# Patient Record
Sex: Male | Born: 1965 | Race: Black or African American | Hispanic: No | Marital: Single
Health system: Southern US, Community
[De-identification: ages and names within clinical notes are randomized; demographics above are authoritative.]

---

## 2005-07-02 ENCOUNTER — Emergency Department: Payer: Self-pay | Admitting: Unknown Physician Specialty

## 2005-07-20 ENCOUNTER — Ambulatory Visit: Payer: Self-pay | Admitting: Specialist

## 2006-02-21 ENCOUNTER — Emergency Department: Payer: Self-pay | Admitting: Emergency Medicine

## 2006-03-17 ENCOUNTER — Inpatient Hospital Stay: Payer: Self-pay | Admitting: Internal Medicine

## 2006-07-08 ENCOUNTER — Ambulatory Visit: Payer: Self-pay | Admitting: Urology

## 2006-10-18 ENCOUNTER — Ambulatory Visit: Payer: Self-pay | Admitting: Urology

## 2006-12-01 ENCOUNTER — Emergency Department: Payer: Self-pay | Admitting: Emergency Medicine

## 2006-12-05 ENCOUNTER — Inpatient Hospital Stay: Payer: Self-pay | Admitting: *Deleted

## 2007-01-27 ENCOUNTER — Emergency Department: Payer: Self-pay | Admitting: Emergency Medicine

## 2007-05-23 ENCOUNTER — Emergency Department: Payer: Self-pay | Admitting: Emergency Medicine

## 2007-06-21 ENCOUNTER — Emergency Department: Payer: Self-pay | Admitting: Internal Medicine

## 2007-09-05 ENCOUNTER — Encounter: Payer: Self-pay | Admitting: Specialist

## 2007-09-12 ENCOUNTER — Emergency Department: Payer: Self-pay | Admitting: Emergency Medicine

## 2007-09-18 ENCOUNTER — Inpatient Hospital Stay: Payer: Self-pay | Admitting: Internal Medicine

## 2007-10-04 ENCOUNTER — Encounter: Payer: Self-pay | Admitting: Specialist

## 2007-11-13 ENCOUNTER — Emergency Department: Payer: Self-pay | Admitting: Emergency Medicine

## 2007-12-07 ENCOUNTER — Ambulatory Visit: Payer: Self-pay | Admitting: Specialist

## 2007-12-20 ENCOUNTER — Ambulatory Visit: Payer: Self-pay | Admitting: Specialist

## 2008-03-01 ENCOUNTER — Other Ambulatory Visit: Payer: Self-pay

## 2008-03-01 ENCOUNTER — Emergency Department: Payer: Self-pay | Admitting: Emergency Medicine

## 2008-03-11 ENCOUNTER — Ambulatory Visit: Payer: Self-pay | Admitting: Family Medicine

## 2008-03-21 ENCOUNTER — Emergency Department: Payer: Self-pay | Admitting: Internal Medicine

## 2008-04-14 ENCOUNTER — Inpatient Hospital Stay: Payer: Self-pay | Admitting: Internal Medicine

## 2008-05-22 ENCOUNTER — Ambulatory Visit: Payer: Self-pay | Admitting: Vascular Surgery

## 2008-06-13 ENCOUNTER — Ambulatory Visit: Payer: Self-pay | Admitting: Vascular Surgery

## 2008-06-24 ENCOUNTER — Emergency Department: Payer: Self-pay | Admitting: Emergency Medicine

## 2008-11-18 ENCOUNTER — Inpatient Hospital Stay: Payer: Self-pay | Admitting: Internal Medicine

## 2008-12-30 ENCOUNTER — Emergency Department: Payer: Self-pay | Admitting: Internal Medicine

## 2009-01-02 ENCOUNTER — Ambulatory Visit: Payer: Self-pay | Admitting: Oncology

## 2009-01-18 ENCOUNTER — Inpatient Hospital Stay: Payer: Self-pay | Admitting: Internal Medicine

## 2009-02-01 ENCOUNTER — Inpatient Hospital Stay: Payer: Self-pay | Admitting: Internal Medicine

## 2009-02-02 ENCOUNTER — Ambulatory Visit: Payer: Self-pay | Admitting: Oncology

## 2009-03-13 ENCOUNTER — Inpatient Hospital Stay: Payer: Self-pay | Admitting: Internal Medicine

## 2009-04-21 ENCOUNTER — Inpatient Hospital Stay: Payer: Self-pay | Admitting: Internal Medicine

## 2009-04-30 ENCOUNTER — Inpatient Hospital Stay: Payer: Self-pay | Admitting: Internal Medicine

## 2009-06-23 ENCOUNTER — Emergency Department: Payer: Self-pay | Admitting: Internal Medicine

## 2009-07-02 ENCOUNTER — Inpatient Hospital Stay: Payer: Self-pay | Admitting: Internal Medicine

## 2009-07-27 ENCOUNTER — Emergency Department: Payer: Self-pay | Admitting: Internal Medicine

## 2009-08-01 ENCOUNTER — Inpatient Hospital Stay: Payer: Self-pay | Admitting: Internal Medicine

## 2009-09-14 ENCOUNTER — Inpatient Hospital Stay: Payer: Self-pay | Admitting: Internal Medicine

## 2009-10-09 ENCOUNTER — Inpatient Hospital Stay: Payer: Self-pay | Admitting: Specialist

## 2010-02-18 ENCOUNTER — Ambulatory Visit: Payer: Self-pay | Admitting: Family Medicine

## 2010-03-10 ENCOUNTER — Ambulatory Visit: Payer: Self-pay | Admitting: Family Medicine

## 2010-03-14 ENCOUNTER — Ambulatory Visit: Payer: Self-pay | Admitting: Family Medicine

## 2010-12-01 ENCOUNTER — Ambulatory Visit: Payer: Self-pay | Admitting: Family Medicine

## 2011-07-30 ENCOUNTER — Ambulatory Visit: Payer: Self-pay

## 2011-10-11 ENCOUNTER — Encounter: Payer: Self-pay | Admitting: Cardiothoracic Surgery

## 2011-10-11 ENCOUNTER — Encounter: Payer: Self-pay | Admitting: Nurse Practitioner

## 2011-11-03 ENCOUNTER — Encounter: Payer: Self-pay | Admitting: Cardiothoracic Surgery

## 2011-11-03 ENCOUNTER — Encounter: Payer: Self-pay | Admitting: Nurse Practitioner

## 2011-11-15 ENCOUNTER — Inpatient Hospital Stay: Payer: Self-pay | Admitting: Specialist

## 2011-11-15 LAB — COMPREHENSIVE METABOLIC PANEL
Albumin: 2.5 g/dL — ABNORMAL LOW (ref 3.4–5.0)
Alkaline Phosphatase: 100 U/L (ref 50–136)
Anion Gap: 12 (ref 7–16)
Bilirubin,Total: 0.2 mg/dL (ref 0.2–1.0)
Chloride: 106 mmol/L (ref 98–107)
Creatinine: 0.68 mg/dL (ref 0.60–1.30)
EGFR (African American): >60
EGFR (Non-African Amer.): >60
Osmolality: 272 (ref 275–301)
SGOT(AST): 16 U/L (ref 15–37)
Total Protein: 8.1 g/dL (ref 6.4–8.2)

## 2011-11-15 LAB — URINALYSIS, COMPLETE
Bilirubin,UR: NEGATIVE
Hyaline Cast: 10
Ph: 6 (ref 4.5–8.0)
RBC,UR: 19 /HPF (ref 0–5)
Specific Gravity: 1.011 (ref 1.003–1.030)

## 2011-11-15 LAB — CBC WITH DIFFERENTIAL/PLATELET
Basophil #: 0.2 10*3/uL — ABNORMAL HIGH (ref 0.0–0.1)
Lymphocyte %: 24.9 %
MCH: 30.3 pg (ref 26.0–34.0)
MCV: 91 fL (ref 80–100)
Monocyte #: 1.3 x10 3/mm — ABNORMAL HIGH (ref 0.2–1.0)
Neutrophil %: 59.9 %
Platelet: 636 10*3/uL — ABNORMAL HIGH (ref 150–440)
RBC: 4.51 10*6/uL (ref 4.40–5.90)
RDW: 16.2 % — ABNORMAL HIGH (ref 11.5–14.5)
WBC: 13.2 10*3/uL — ABNORMAL HIGH (ref 3.8–10.6)

## 2011-11-16 LAB — BASIC METABOLIC PANEL
Anion Gap: 12 (ref 7–16)
BUN: 4 mg/dL — ABNORMAL LOW (ref 7–18)
Calcium, Total: 7.4 mg/dL — ABNORMAL LOW (ref 8.5–10.1)
Chloride: 107 mmol/L (ref 98–107)
Creatinine: 0.51 mg/dL — ABNORMAL LOW (ref 0.60–1.30)

## 2011-11-16 LAB — CBC WITH DIFFERENTIAL/PLATELET
Basophil %: 1 %
Eosinophil %: 5.5 %
HCT: 32.8 % — ABNORMAL LOW (ref 40.0–52.0)
HGB: 10.6 g/dL — ABNORMAL LOW (ref 13.0–18.0)
Lymphocyte #: 2.8 10*3/uL (ref 1.0–3.6)
Lymphocyte %: 32.2 %
MCH: 29.1 pg (ref 26.0–34.0)
MCV: 90 fL (ref 80–100)
Neutrophil #: 3.9 10*3/uL (ref 1.4–6.5)
Neutrophil %: 44.9 %
Platelet: 551 10*3/uL — ABNORMAL HIGH (ref 150–440)
RBC: 3.64 10*6/uL — ABNORMAL LOW (ref 4.40–5.90)
RDW: 16.1 % — ABNORMAL HIGH (ref 11.5–14.5)
WBC: 8.8 10*3/uL (ref 3.8–10.6)

## 2011-11-19 LAB — URINALYSIS, COMPLETE
Bilirubin,UR: NEGATIVE
Nitrite: POSITIVE
RBC,UR: 31 /HPF (ref 0–5)
Squamous Epithelial: 2
WBC UR: 176 /HPF (ref 0–5)

## 2011-11-20 ENCOUNTER — Inpatient Hospital Stay: Payer: Self-pay | Admitting: Internal Medicine

## 2011-11-20 LAB — COMPREHENSIVE METABOLIC PANEL
Albumin: 2.2 g/dL — ABNORMAL LOW (ref 3.4–5.0)
Anion Gap: 10 (ref 7–16)
Bilirubin,Total: 0.3 mg/dL (ref 0.2–1.0)
Calcium, Total: 8 mg/dL — ABNORMAL LOW (ref 8.5–10.1)
Co2: 23 mmol/L (ref 21–32)
Creatinine: 0.41 mg/dL — ABNORMAL LOW (ref 0.60–1.30)
EGFR (African American): >60
EGFR (Non-African Amer.): >60
Glucose: 76 mg/dL (ref 65–99)
Osmolality: 276 (ref 275–301)
Potassium: 3.5 mmol/L (ref 3.5–5.1)
SGPT (ALT): 11 U/L — ABNORMAL LOW
Sodium: 140 mmol/L (ref 136–145)
Total Protein: 6.9 g/dL (ref 6.4–8.2)

## 2011-11-20 LAB — URINALYSIS, COMPLETE
Bilirubin,UR: NEGATIVE
Glucose,UR: NEGATIVE mg/dL (ref 0–75)
Nitrite: POSITIVE
Ph: 7 (ref 4.5–8.0)
RBC,UR: 440 /HPF (ref 0–5)
Squamous Epithelial: 21
Transitional Epi: 2
WBC UR: 2971 /HPF (ref 0–5)

## 2011-11-20 LAB — CBC
HGB: 12.3 g/dL — ABNORMAL LOW (ref 13.0–18.0)
MCH: 30.2 pg (ref 26.0–34.0)
MCHC: 33.6 g/dL (ref 32.0–36.0)
Platelet: 786 10*3/uL — ABNORMAL HIGH (ref 150–440)
RDW: 16.6 % — ABNORMAL HIGH (ref 11.5–14.5)

## 2011-11-20 LAB — CULTURE, BLOOD (SINGLE)

## 2011-11-23 LAB — CBC WITH DIFFERENTIAL/PLATELET
Basophil #: 0 10*3/uL (ref 0.0–0.1)
Basophil %: 2.1 %
Eosinophil #: 0.6 10*3/uL (ref 0.0–0.7)
Eosinophil %: 7.3 %
HCT: 30.9 % — ABNORMAL LOW (ref 40.0–52.0)
HGB: 10.6 g/dL — ABNORMAL LOW (ref 13.0–18.0)
Lymphocyte %: 39 %
MCHC: 34.3 g/dL (ref 32.0–36.0)
MCV: 90 fL (ref 80–100)
Monocyte %: 14.2 %
Neutrophil #: 3.1 10*3/uL (ref 1.4–6.5)
RBC: 3.44 10*6/uL — ABNORMAL LOW (ref 4.40–5.90)
RDW: 16 % — ABNORMAL HIGH (ref 11.5–14.5)

## 2011-11-23 LAB — URINE CULTURE

## 2011-11-28 LAB — CBC
HCT: 35.4 % — ABNORMAL LOW (ref 40.0–52.0)
MCH: 29.2 pg (ref 26.0–34.0)
MCV: 89 fL (ref 80–100)
RBC: 3.97 10*6/uL — ABNORMAL LOW (ref 4.40–5.90)
WBC: 9.5 10*3/uL (ref 3.8–10.6)

## 2011-11-28 LAB — URINALYSIS, COMPLETE
Bilirubin,UR: NEGATIVE
Glucose,UR: NEGATIVE mg/dL (ref 0–75)
Hyaline Cast: 3
Nitrite: NEGATIVE
RBC,UR: 50 /HPF (ref 0–5)
Specific Gravity: 1.017 (ref 1.003–1.030)
Squamous Epithelial: 2

## 2011-11-28 LAB — COMPREHENSIVE METABOLIC PANEL
Anion Gap: 13 (ref 7–16)
BUN: 3 mg/dL — ABNORMAL LOW (ref 7–18)
Calcium, Total: 7.5 mg/dL — ABNORMAL LOW (ref 8.5–10.1)
Chloride: 111 mmol/L — ABNORMAL HIGH (ref 98–107)
Co2: 19 mmol/L — ABNORMAL LOW (ref 21–32)
Creatinine: 0.64 mg/dL (ref 0.60–1.30)
EGFR (Non-African Amer.): >60
Glucose: 78 mg/dL (ref 65–99)
SGOT(AST): 22 U/L (ref 15–37)
SGPT (ALT): 10 U/L — ABNORMAL LOW
Sodium: 143 mmol/L (ref 136–145)

## 2011-11-28 LAB — CK TOTAL AND CKMB (NOT AT ARMC): CK-MB: 2 ng/mL (ref 0.5–3.6)

## 2011-11-29 LAB — BASIC METABOLIC PANEL
BUN: 2 mg/dL — ABNORMAL LOW (ref 7–18)
Chloride: 116 mmol/L — ABNORMAL HIGH (ref 98–107)
Creatinine: 0.58 mg/dL — ABNORMAL LOW (ref 0.60–1.30)
EGFR (African American): >60
EGFR (Non-African Amer.): >60
Osmolality: 289 (ref 275–301)
Potassium: 3.1 mmol/L — ABNORMAL LOW (ref 3.5–5.1)

## 2011-11-30 LAB — BASIC METABOLIC PANEL
Anion Gap: 11 (ref 7–16)
BUN: 3 mg/dL — ABNORMAL LOW (ref 7–18)
Co2: 20 mmol/L — ABNORMAL LOW (ref 21–32)
EGFR (African American): >60
EGFR (Non-African Amer.): >60
Glucose: 85 mg/dL (ref 65–99)
Osmolality: 284 (ref 275–301)
Sodium: 145 mmol/L (ref 136–145)

## 2011-11-30 LAB — CLOSTRIDIUM DIFFICILE BY PCR

## 2011-12-01 ENCOUNTER — Inpatient Hospital Stay: Payer: Self-pay | Admitting: Internal Medicine

## 2011-12-04 ENCOUNTER — Encounter: Payer: Self-pay | Admitting: Cardiothoracic Surgery

## 2011-12-04 ENCOUNTER — Encounter: Payer: Self-pay | Admitting: Nurse Practitioner

## 2011-12-04 LAB — CULTURE, BLOOD (SINGLE)

## 2012-01-03 ENCOUNTER — Encounter: Payer: Self-pay | Admitting: Cardiothoracic Surgery

## 2012-01-31 ENCOUNTER — Ambulatory Visit: Payer: Self-pay

## 2012-02-03 ENCOUNTER — Encounter: Payer: Self-pay | Admitting: Cardiothoracic Surgery

## 2012-03-05 ENCOUNTER — Inpatient Hospital Stay: Payer: Self-pay | Admitting: Specialist

## 2012-03-05 LAB — URINALYSIS, COMPLETE
Bilirubin,UR: NEGATIVE
Glucose,UR: NEGATIVE mg/dL (ref 0–75)
Protein: 100
RBC,UR: 113 /HPF (ref 0–5)
Specific Gravity: 1.016 (ref 1.003–1.030)
Squamous Epithelial: 7
WBC UR: 659 /HPF (ref 0–5)

## 2012-03-05 LAB — COMPREHENSIVE METABOLIC PANEL
Anion Gap: 14 (ref 7–16)
BUN: 10 mg/dL (ref 7–18)
Bilirubin,Total: 0.3 mg/dL (ref 0.2–1.0)
Calcium, Total: 8.7 mg/dL (ref 8.5–10.1)
Chloride: 111 mmol/L — ABNORMAL HIGH (ref 98–107)
Co2: 17 mmol/L — ABNORMAL LOW (ref 21–32)
EGFR (African American): >60
EGFR (Non-African Amer.): >60
Potassium: 3.6 mmol/L (ref 3.5–5.1)
SGOT(AST): 23 U/L (ref 15–37)
SGPT (ALT): 12 U/L (ref 12–78)
Total Protein: 8.2 g/dL (ref 6.4–8.2)

## 2012-03-05 LAB — CBC WITH DIFFERENTIAL/PLATELET
Basophil #: 0.2 10*3/uL — ABNORMAL HIGH (ref 0.0–0.1)
Basophil %: 1.3 %
Eosinophil #: 0.3 10*3/uL (ref 0.0–0.7)
HCT: 37 % — ABNORMAL LOW (ref 40.0–52.0)
HGB: 12 g/dL — ABNORMAL LOW (ref 13.0–18.0)
Lymphocyte #: 1.9 10*3/uL (ref 1.0–3.6)
Lymphocyte %: 14.2 %
MCH: 27.7 pg (ref 26.0–34.0)
MCHC: 32.4 g/dL (ref 32.0–36.0)
MCV: 85 fL (ref 80–100)
Neutrophil #: 10 10*3/uL — ABNORMAL HIGH (ref 1.4–6.5)
Neutrophil %: 73 %
RBC: 4.34 10*6/uL — ABNORMAL LOW (ref 4.40–5.90)
RDW: 17 % — ABNORMAL HIGH (ref 11.5–14.5)

## 2012-03-06 LAB — BASIC METABOLIC PANEL
Calcium, Total: 8.7 mg/dL (ref 8.5–10.1)
Chloride: 108 mmol/L — ABNORMAL HIGH (ref 98–107)
Creatinine: 0.63 mg/dL (ref 0.60–1.30)
EGFR (African American): >60
EGFR (Non-African Amer.): >60
Glucose: 82 mg/dL (ref 65–99)
Osmolality: 275 (ref 275–301)

## 2012-03-06 LAB — CBC WITH DIFFERENTIAL/PLATELET
Basophil #: 0.1 10*3/uL (ref 0.0–0.1)
Eosinophil #: 0.9 10*3/uL — ABNORMAL HIGH (ref 0.0–0.7)
HGB: 10.4 g/dL — ABNORMAL LOW (ref 13.0–18.0)
Lymphocyte #: 2.8 10*3/uL (ref 1.0–3.6)
Lymphocyte %: 34.9 %
MCHC: 32.7 g/dL (ref 32.0–36.0)
Monocyte #: 1.1 x10 3/mm — ABNORMAL HIGH (ref 0.2–1.0)
Neutrophil #: 3.1 10*3/uL (ref 1.4–6.5)
Neutrophil %: 39.2 %
Platelet: 470 10*3/uL — ABNORMAL HIGH (ref 150–440)
RBC: 3.73 10*6/uL — ABNORMAL LOW (ref 4.40–5.90)
WBC: 8 10*3/uL (ref 3.8–10.6)

## 2012-03-06 LAB — MAGNESIUM: Magnesium: 2.1 mg/dL

## 2012-03-11 LAB — CULTURE, BLOOD (SINGLE)

## 2012-04-06 ENCOUNTER — Encounter: Payer: Self-pay | Admitting: Cardiothoracic Surgery

## 2012-04-06 ENCOUNTER — Encounter: Payer: Self-pay | Admitting: Nurse Practitioner

## 2012-05-05 ENCOUNTER — Encounter: Payer: Self-pay | Admitting: Nurse Practitioner

## 2012-05-05 ENCOUNTER — Encounter: Payer: Self-pay | Admitting: Cardiothoracic Surgery

## 2012-05-31 IMAGING — CT CT ABD-PELV W/O CM
1 of 2 series · 14 of 32 positions shown, 18 images · non-contrast
Comparison: none

REASON FOR EXAM: (1) intractable nausea, vomiting nad diarrhea; (2)
intractable nausea, vomiting
COMMENTS:

PROCEDURE:     CT  - CT ABDOMEN AND PELVIS W[DATE] [DATE]
RESULT:
Comparison is made to a prior study dated 04/28/2009.
TECHNIQUE: Helical noncontrast 3 mm sections were obtained from the lung
bases through the pubic symphysis.

[Series 2: 3mm soft tissue · axial · 0.85mm/px · z∈[-822,-394]mm · 14 of 163 slices shown, 18 images]
[im 13/163  soft-tissue]
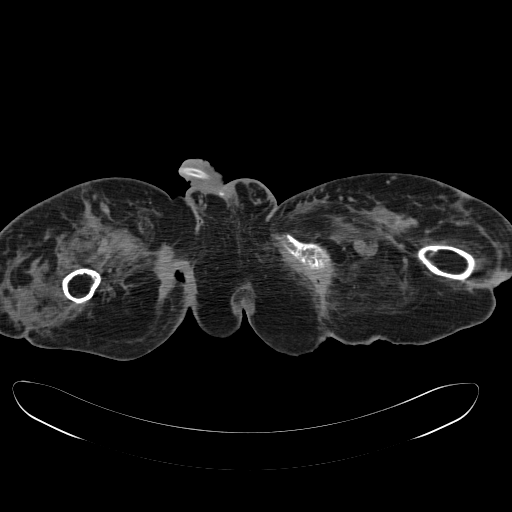
[im 13/163  bone]
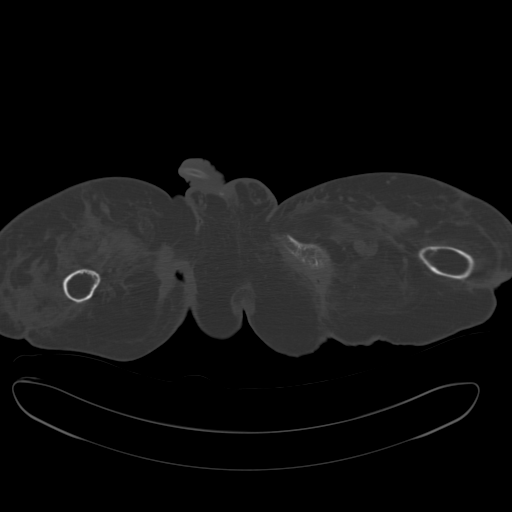
[im 26/163  soft-tissue]
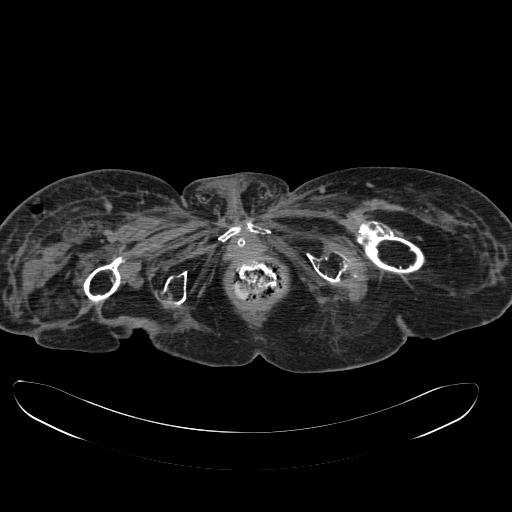
[im 39/163  soft-tissue]
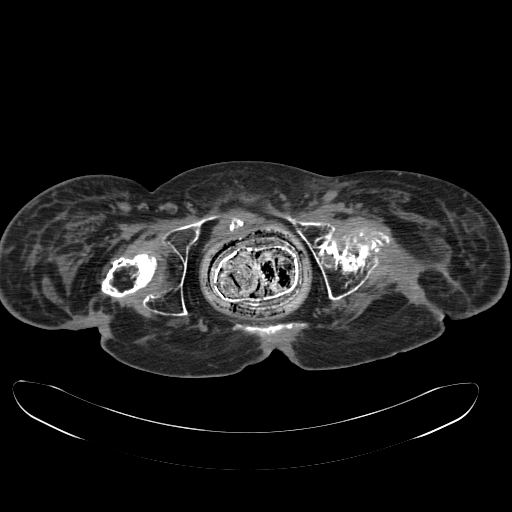
[im 52/163  soft-tissue]
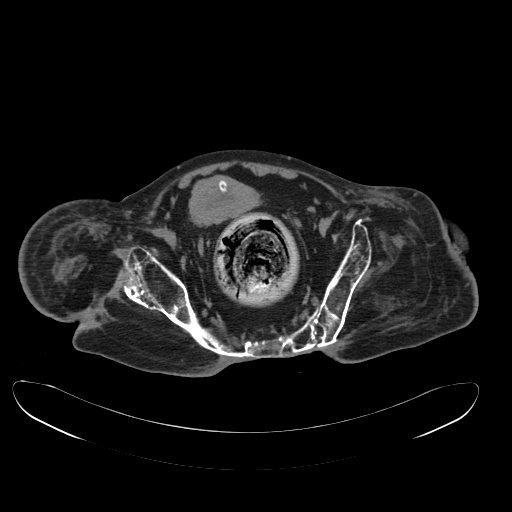
[im 65/163  soft-tissue]
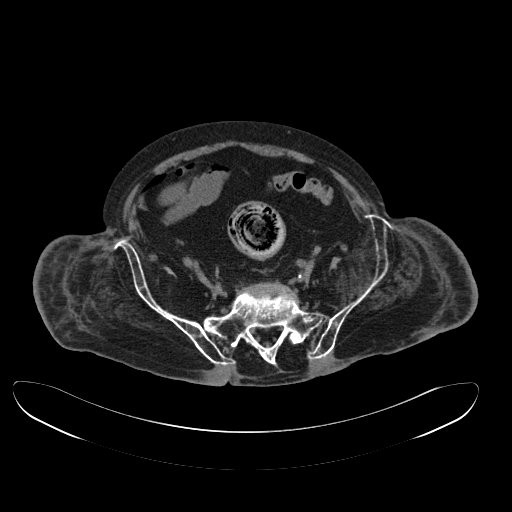
[im 78/163  soft-tissue]
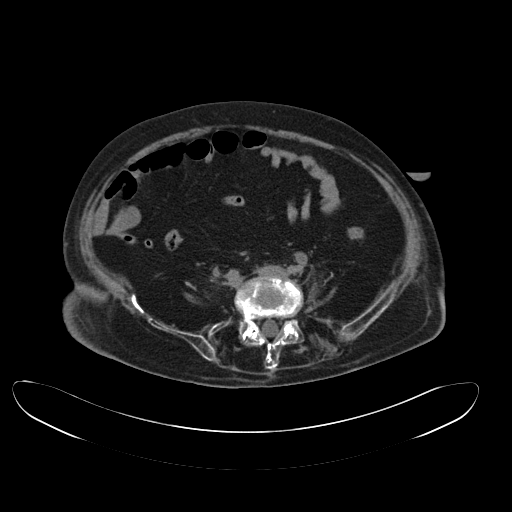
[im 91/163  soft-tissue]
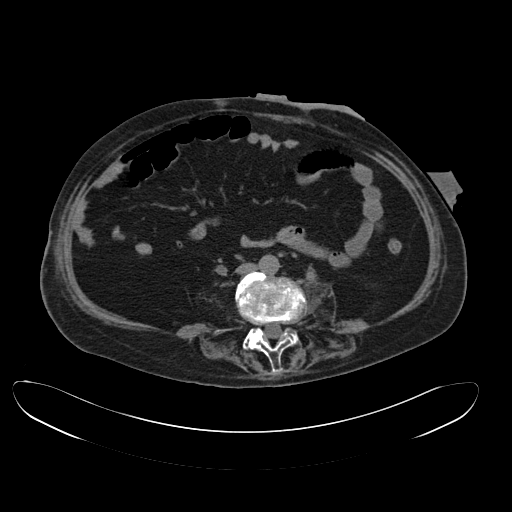
[im 104/163  soft-tissue]
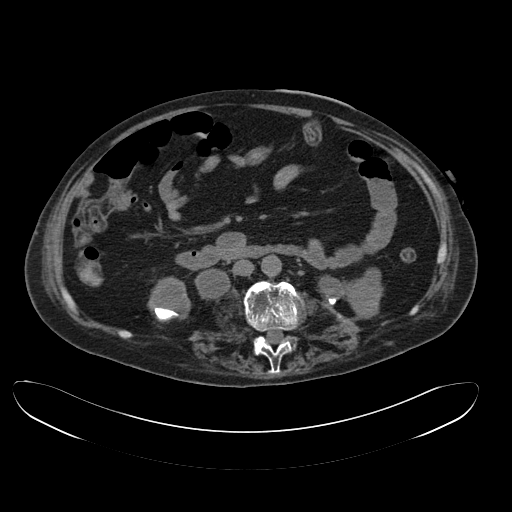
[im 117/163  soft-tissue]
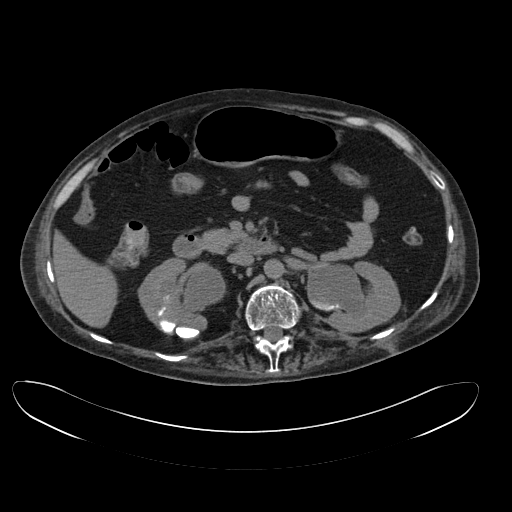
[im 117/163  bone]
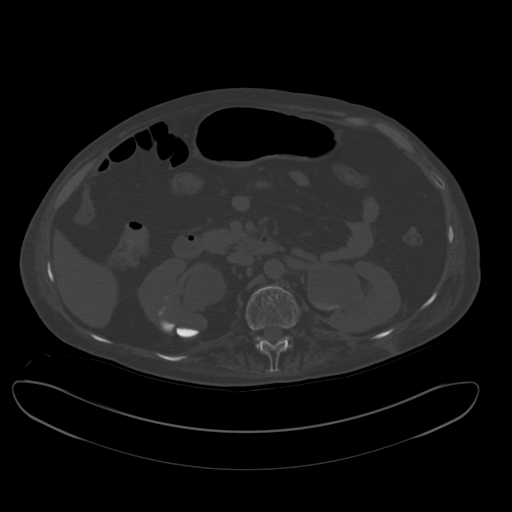
[im 130/163  soft-tissue]
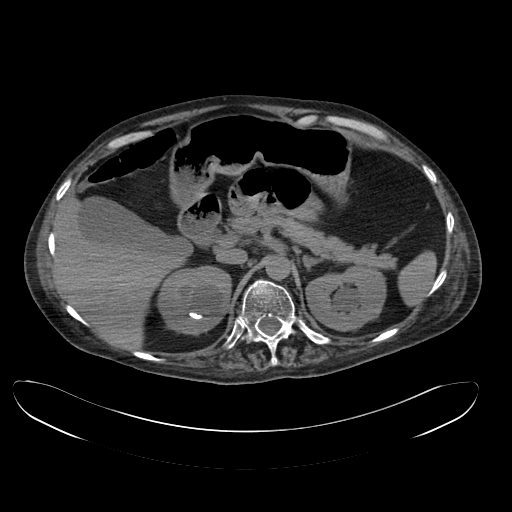
[im 137/163  lung]
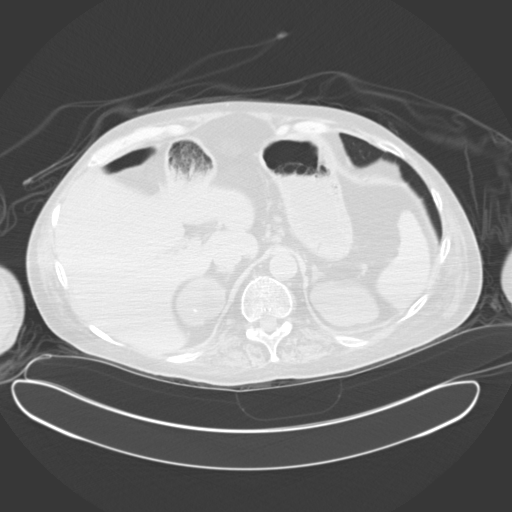
[im 143/163  soft-tissue]
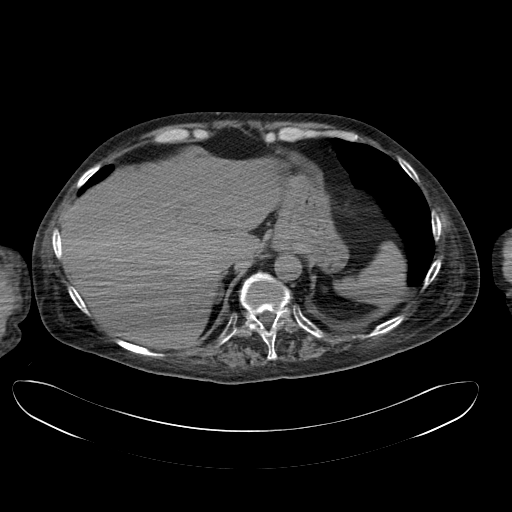
[im 143/163  lung]
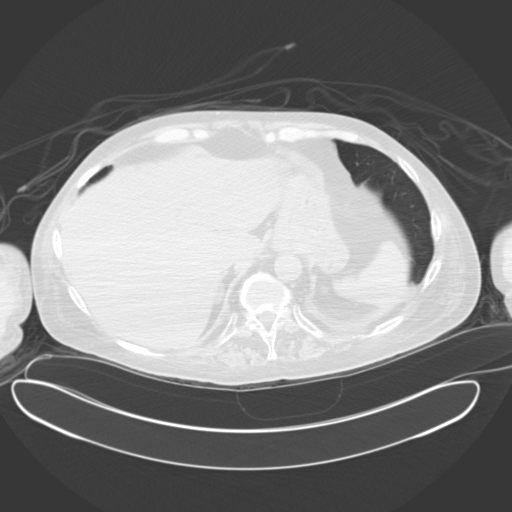
[im 150/163  lung]
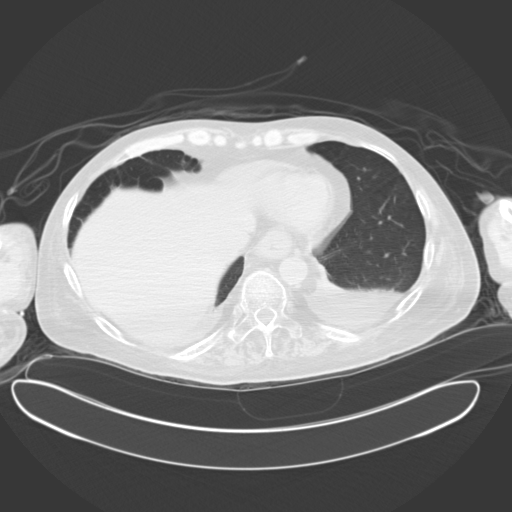
[im 156/163  soft-tissue]
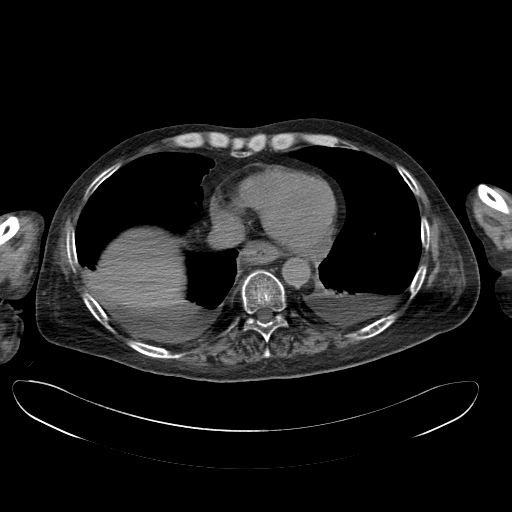
[im 156/163  lung]
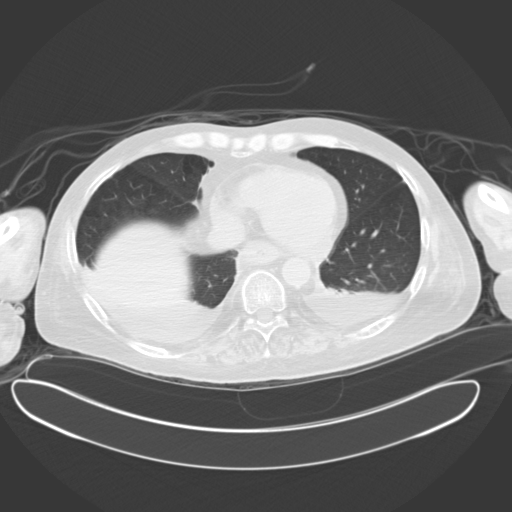

[14 of 32 positions shown; findings below may reference images not displayed]

FINDINGS: Evaluation of the lung bases demonstrates small, bilateral
effusions.

Noncontrast evaluation of the liver, spleen, adrenals, and pancreas is
unremarkable.

Chronic hydronephrosis identified involving the right kidney and unchanged
when compared to the previous study. Coarse medullary calculi are identified
within the right kidney. There appears to be decreased calcific density
within the dependent portion of the renal pelvis. Within the distal right
ureter, a 4.9 mm calculus is identified. When compared to the previous
study, the ureteral calcific burden has decreased. The ureter is also
decompressed when compared to the previous study. There is chronic moderate
to severe hydronephrosis. The left kidney also demonstrates chronic
hydronephrosis. The calculus disease within the renal corticomedullary
portion of the kidney has decreased significantly when compared to the
previous study. Dependent calculi are identified within the dilated renal
pelvis. Hydroureter is appreciated on the left and a 4.1 mm calculus is
identified within the distal left ureter. The ureter is distended to the
level of the urinary bladder.

There is no CT evidence of bowel obstruction, enteritis, colitis or
diverticulitis. A large amount of stool is once again identified within the
rectal vault which, when compared to the previous study, appears to be
unchanged. The stool demonstrates a laminated calcified rim and these
findings are concerning for impacted stool within the rectum. There is mild
thickening of the rectal wall. No evidence of abdominal or pelvic free
fluid, loculated fluid collections, masses or adenopathy is appreciated or
evidence of an abdominal aortic aneurysm.

An ostomy is appreciated within the left lower quadrant. The involved bowel
appears unremarkable. There is no evidence of peristomal loculated fluid
collections, free fluid or masses.
IMPRESSION: 1.  Chronic bilateral hydronephrosis as well as chronic nephrolithiasis and
ureterolithiasis.
2.  No CT evidence of bowel obstruction.
3.  No evidence of an abdominal aortic aneurysm.
4.  Note, not mentioned above, the stomach appears to be partially
air-filled and mild to moderately distended.
5.  Small, bilateral effusions.
6.  Not mentioned above, severe destruction of the humeral heads is
appreciated. There is a component of thinning of the acetabuli. The bones
are osteopenic.
7.  Findings likely reflecting impacted stool within the rectal vault.

## 2012-06-01 IMAGING — CR DG ABDOMEN 1V
1 series · 2 of 2 positions shown · non-contrast
Comparison: none

REASON FOR EXAM: fecal impaction
COMMENTS:

[Series 1: ap · 0.17mm/px · 2 of 2 slices shown]
[im 1/2]
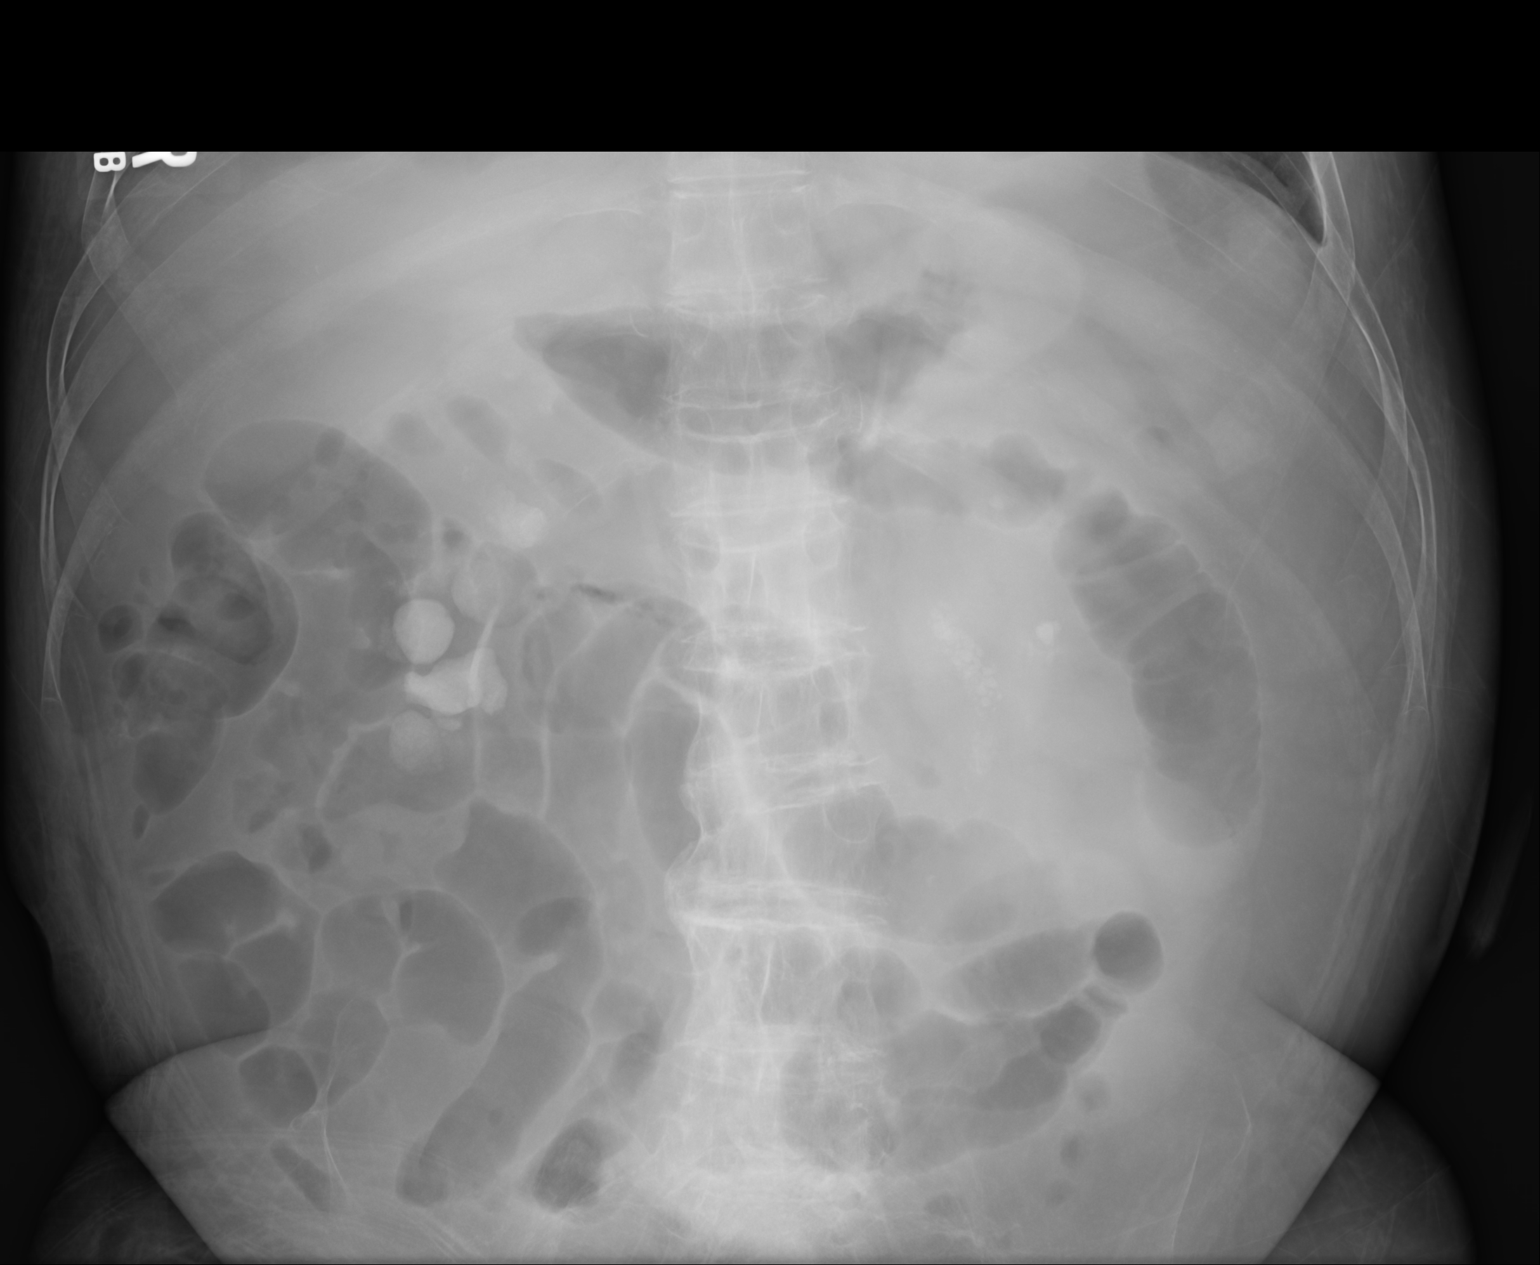
[im 2/2]
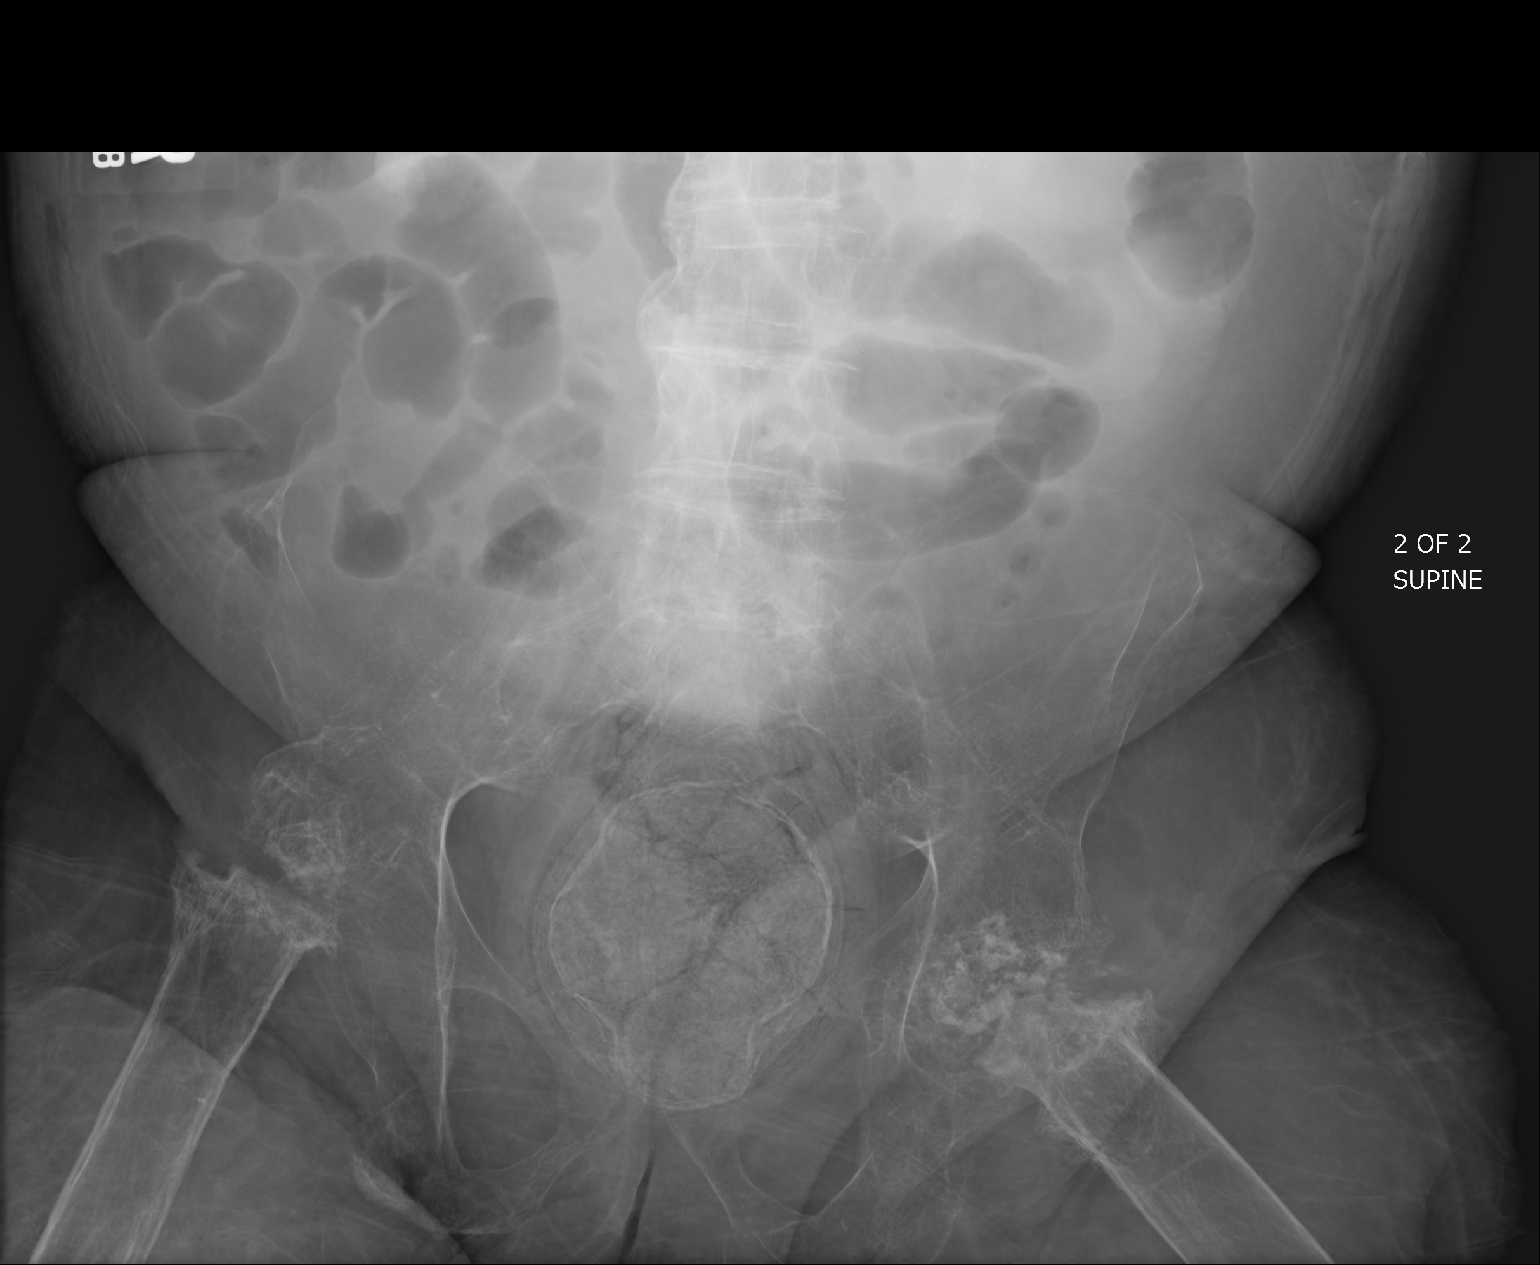

[2 of 2 positions shown; findings below may reference images not displayed]

PROCEDURE:     DXR - DXR ABDOMEN AP ONLY  - December 01, 2011  [DATE]

RESULT:     Comparison is made to the prior exam of 11/29/2011. Staghorn
calcification is again observed on the right. On the left, there are a few
small calcifications observed in the mid and upper pole and a cluster of
small calcifications medially consistent with stone fragments that have not
changed in location as compared to the exam of 2 days ago. No definite
ureteral calcifications are seen. There is again noted extensive deformity
of the hips bilaterally. A 7 cm calcified mass is again seen in the rectum
and is stable as compared to prior examinations. This may represent
calcified fecal material as previously suggested. Small bowel gas is mildly
prominant but no bowel obstruction is evident.
IMPRESSION: 1.     Bilateral nephrolithiasis.
2. Chronic stool in the rectum that appears calcified.

## 2012-07-04 ENCOUNTER — Ambulatory Visit: Payer: Self-pay | Admitting: Internal Medicine

## 2012-07-13 ENCOUNTER — Ambulatory Visit: Payer: Self-pay | Admitting: Vascular Surgery

## 2012-07-20 ENCOUNTER — Encounter: Payer: Self-pay | Admitting: Nurse Practitioner

## 2012-07-20 ENCOUNTER — Encounter: Payer: Self-pay | Admitting: Cardiothoracic Surgery

## 2012-11-28 ENCOUNTER — Inpatient Hospital Stay: Payer: Self-pay | Admitting: Internal Medicine

## 2012-11-28 LAB — CBC WITH DIFFERENTIAL/PLATELET
Basophil #: 0.1 10*3/uL (ref 0.0–0.1)
Basophil %: 0.7 %
HGB: 12.2 g/dL — ABNORMAL LOW (ref 13.0–18.0)
Lymphocyte %: 20.9 %
MCHC: 32.6 g/dL (ref 32.0–36.0)
MCV: 80 fL (ref 80–100)
Monocyte %: 7.3 %
Neutrophil #: 10.2 10*3/uL — ABNORMAL HIGH (ref 1.4–6.5)
Platelet: 755 10*3/uL — ABNORMAL HIGH (ref 150–440)
RBC: 4.7 10*6/uL (ref 4.40–5.90)

## 2012-11-28 LAB — URINALYSIS, COMPLETE
Bilirubin,UR: NEGATIVE
Glucose,UR: NEGATIVE mg/dL (ref 0–75)
Ketone: NEGATIVE
Ph: 7 (ref 4.5–8.0)
Protein: 100
RBC,UR: 79 /HPF (ref 0–5)
Specific Gravity: 1.009 (ref 1.003–1.030)
WBC UR: 1118 /HPF (ref 0–5)

## 2012-11-28 LAB — MAGNESIUM: Magnesium: 1.7 mg/dL — ABNORMAL LOW

## 2012-11-29 LAB — COMPREHENSIVE METABOLIC PANEL
Albumin: 1.8 g/dL — ABNORMAL LOW (ref 3.4–5.0)
BUN: 8 mg/dL (ref 7–18)
Bilirubin,Total: 0.3 mg/dL (ref 0.2–1.0)
Calcium, Total: 8.6 mg/dL (ref 8.5–10.1)
Chloride: 105 mmol/L (ref 98–107)
Creatinine: 0.9 mg/dL (ref 0.60–1.30)
EGFR (African American): >60
EGFR (Non-African Amer.): >60
Glucose: 94 mg/dL (ref 65–99)
Osmolality: 268 (ref 275–301)
Potassium: 2.8 mmol/L — ABNORMAL LOW (ref 3.5–5.1)
SGOT(AST): 14 U/L — ABNORMAL LOW (ref 15–37)
Sodium: 135 mmol/L — ABNORMAL LOW (ref 136–145)
Total Protein: 7.2 g/dL (ref 6.4–8.2)

## 2012-11-29 LAB — URINE CULTURE

## 2012-11-30 LAB — BASIC METABOLIC PANEL
Anion Gap: 7 (ref 7–16)
Calcium, Total: 7.9 mg/dL — ABNORMAL LOW (ref 8.5–10.1)
Chloride: 115 mmol/L — ABNORMAL HIGH (ref 98–107)
Co2: 18 mmol/L — ABNORMAL LOW (ref 21–32)
Creatinine: 0.48 mg/dL — ABNORMAL LOW (ref 0.60–1.30)
EGFR (African American): >60
Glucose: 73 mg/dL (ref 65–99)
Osmolality: 275 (ref 275–301)
Potassium: 4.6 mmol/L (ref 3.5–5.1)
Sodium: 140 mmol/L (ref 136–145)

## 2012-11-30 LAB — CBC WITH DIFFERENTIAL/PLATELET
Basophil %: 3.2 %
Eosinophil #: 0.3 10*3/uL (ref 0.0–0.7)
Eosinophil %: 2 %
HGB: 9.4 g/dL — ABNORMAL LOW (ref 13.0–18.0)
Lymphocyte #: 3.2 10*3/uL (ref 1.0–3.6)
Lymphocyte %: 24.3 %
MCH: 25.8 pg — ABNORMAL LOW (ref 26.0–34.0)
MCHC: 32.8 g/dL (ref 32.0–36.0)
Monocyte #: 1.2 x10 3/mm — ABNORMAL HIGH (ref 0.2–1.0)
Monocyte %: 9.1 %
Platelet: 707 10*3/uL — ABNORMAL HIGH (ref 150–440)
RDW: 18.7 % — ABNORMAL HIGH (ref 11.5–14.5)
WBC: 13.2 10*3/uL — ABNORMAL HIGH (ref 3.8–10.6)

## 2012-12-01 LAB — CBC WITH DIFFERENTIAL/PLATELET
Basophil #: 0.3 10*3/uL — ABNORMAL HIGH (ref 0.0–0.1)
Basophil %: 2.5 %
Lymphocyte #: 3 10*3/uL (ref 1.0–3.6)
MCHC: 32.4 g/dL (ref 32.0–36.0)
MCV: 79 fL — ABNORMAL LOW (ref 80–100)
Monocyte #: 1.2 x10 3/mm — ABNORMAL HIGH (ref 0.2–1.0)
Platelet: 472 10*3/uL — ABNORMAL HIGH (ref 150–440)
RBC: 3.72 10*6/uL — ABNORMAL LOW (ref 4.40–5.90)
RDW: 18.6 % — ABNORMAL HIGH (ref 11.5–14.5)

## 2012-12-03 ENCOUNTER — Ambulatory Visit: Payer: Self-pay | Admitting: Internal Medicine

## 2012-12-03 LAB — BASIC METABOLIC PANEL
Anion Gap: 7 (ref 7–16)
BUN: 2 mg/dL — ABNORMAL LOW (ref 7–18)
Creatinine: 0.52 mg/dL — ABNORMAL LOW (ref 0.60–1.30)
EGFR (Non-African Amer.): >60
Glucose: 76 mg/dL (ref 65–99)
Potassium: 2.9 mmol/L — ABNORMAL LOW (ref 3.5–5.1)

## 2012-12-03 LAB — CBC WITH DIFFERENTIAL/PLATELET
Basophil #: 0 10*3/uL (ref 0.0–0.1)
HCT: 29.7 % — ABNORMAL LOW (ref 40.0–52.0)
HGB: 9.6 g/dL — ABNORMAL LOW (ref 13.0–18.0)
Lymphocyte #: 3.6 10*3/uL (ref 1.0–3.6)
Lymphocyte %: 27.9 %
MCH: 25.4 pg — ABNORMAL LOW (ref 26.0–34.0)
MCHC: 32.4 g/dL (ref 32.0–36.0)
MCV: 79 fL — ABNORMAL LOW (ref 80–100)
Monocyte #: 1.2 x10 3/mm — ABNORMAL HIGH (ref 0.2–1.0)
Neutrophil #: 7.8 10*3/uL — ABNORMAL HIGH (ref 1.4–6.5)
RBC: 3.78 10*6/uL — ABNORMAL LOW (ref 4.40–5.90)
RDW: 18.7 % — ABNORMAL HIGH (ref 11.5–14.5)

## 2012-12-03 LAB — CULTURE, BLOOD (SINGLE)

## 2012-12-04 LAB — MAGNESIUM: Magnesium: 2 mg/dL

## 2012-12-04 LAB — POTASSIUM: Potassium: 3.6 mmol/L (ref 3.5–5.1)

## 2012-12-09 ENCOUNTER — Inpatient Hospital Stay: Payer: Self-pay | Admitting: Internal Medicine

## 2012-12-09 LAB — COMPREHENSIVE METABOLIC PANEL WITH GFR
Albumin: 1.8 g/dL — ABNORMAL LOW
Alkaline Phosphatase: 113 U/L
Anion Gap: 10
BUN: 2 mg/dL — ABNORMAL LOW
Bilirubin,Total: 0.3 mg/dL
Calcium, Total: 8.2 mg/dL — ABNORMAL LOW
Chloride: 114 mmol/L — ABNORMAL HIGH
Co2: 17 mmol/L — ABNORMAL LOW
Creatinine: 0.58 mg/dL — ABNORMAL LOW
EGFR (African American): >60
EGFR (Non-African Amer.): >60
Glucose: 78 mg/dL
Osmolality: 276
Potassium: 2.8 mmol/L — ABNORMAL LOW
SGOT(AST): 19 U/L
SGPT (ALT): 10 U/L — ABNORMAL LOW
Sodium: 141 mmol/L
Total Protein: 7.5 g/dL

## 2012-12-09 LAB — URINALYSIS, COMPLETE
Bilirubin,UR: NEGATIVE
Glucose,UR: NEGATIVE mg/dL (ref 0–75)
Ketone: NEGATIVE
Nitrite: POSITIVE
Ph: 7 (ref 4.5–8.0)
Protein: 100
Specific Gravity: 1.01 (ref 1.003–1.030)
Squamous Epithelial: 19
WBC UR: 1533 /HPF (ref 0–5)

## 2012-12-09 LAB — CBC
HCT: 31.6 % — ABNORMAL LOW (ref 40.0–52.0)
RBC: 4.02 10*6/uL — ABNORMAL LOW (ref 4.40–5.90)
RDW: 18.9 % — ABNORMAL HIGH (ref 11.5–14.5)
WBC: 13.4 10*3/uL — ABNORMAL HIGH (ref 3.8–10.6)

## 2012-12-09 LAB — LIPASE, BLOOD: Lipase: 101 U/L (ref 73–393)

## 2012-12-09 LAB — AMYLASE: Amylase: 48 U/L (ref 25–115)

## 2012-12-10 ENCOUNTER — Ambulatory Visit: Payer: Self-pay | Admitting: Urology

## 2012-12-10 LAB — COMPREHENSIVE METABOLIC PANEL
Albumin: 1.7 g/dL — ABNORMAL LOW (ref 3.4–5.0)
Alkaline Phosphatase: 116 U/L (ref 50–136)
BUN: 1 mg/dL — ABNORMAL LOW (ref 7–18)
Bilirubin,Total: 0.3 mg/dL (ref 0.2–1.0)
Calcium, Total: 7.9 mg/dL — ABNORMAL LOW (ref 8.5–10.1)
Chloride: 113 mmol/L — ABNORMAL HIGH (ref 98–107)
Creatinine: 0.57 mg/dL — ABNORMAL LOW (ref 0.60–1.30)
EGFR (African American): >60
Glucose: 92 mg/dL (ref 65–99)
Potassium: 3.1 mmol/L — ABNORMAL LOW (ref 3.5–5.1)
SGOT(AST): 15 U/L (ref 15–37)
SGPT (ALT): 9 U/L — ABNORMAL LOW (ref 12–78)
Sodium: 139 mmol/L (ref 136–145)

## 2012-12-10 LAB — CBC WITH DIFFERENTIAL/PLATELET
Basophil #: 0.1 10*3/uL (ref 0.0–0.1)
Basophil %: 0.6 %
Eosinophil #: 0.2 10*3/uL (ref 0.0–0.7)
Eosinophil %: 1.7 %
Lymphocyte #: 4.6 10*3/uL — ABNORMAL HIGH (ref 1.0–3.6)
Lymphocyte %: 34.6 %
MCH: 25.9 pg — ABNORMAL LOW (ref 26.0–34.0)
MCHC: 32.7 g/dL (ref 32.0–36.0)
MCV: 79 fL — ABNORMAL LOW (ref 80–100)
Monocyte #: 2 x10 3/mm — ABNORMAL HIGH (ref 0.2–1.0)
Neutrophil #: 6.4 10*3/uL (ref 1.4–6.5)
Neutrophil %: 48.2 %
RBC: 3.76 10*6/uL — ABNORMAL LOW (ref 4.40–5.90)
RDW: 18.7 % — ABNORMAL HIGH (ref 11.5–14.5)
WBC: 13.2 10*3/uL — ABNORMAL HIGH (ref 3.8–10.6)

## 2012-12-11 LAB — BASIC METABOLIC PANEL
Anion Gap: 9 (ref 7–16)
Calcium, Total: 7.9 mg/dL — ABNORMAL LOW (ref 8.5–10.1)
Chloride: 114 mmol/L — ABNORMAL HIGH (ref 98–107)
Creatinine: 0.53 mg/dL — ABNORMAL LOW (ref 0.60–1.30)
EGFR (African American): >60
Glucose: 106 mg/dL — ABNORMAL HIGH (ref 65–99)
Osmolality: 278 (ref 275–301)

## 2012-12-11 LAB — CBC WITH DIFFERENTIAL/PLATELET
Basophil #: 0.1 10*3/uL (ref 0.0–0.1)
Basophil %: 1.1 %
Eosinophil #: 0.4 10*3/uL (ref 0.0–0.7)
Eosinophil %: 3.8 %
HCT: 28.6 % — ABNORMAL LOW (ref 40.0–52.0)
Lymphocyte %: 29.9 %
MCV: 79 fL — ABNORMAL LOW (ref 80–100)
Monocyte #: 1.8 x10 3/mm — ABNORMAL HIGH (ref 0.2–1.0)
Neutrophil #: 5.5 10*3/uL (ref 1.4–6.5)
Platelet: 581 10*3/uL — ABNORMAL HIGH (ref 150–440)
RBC: 3.6 10*6/uL — ABNORMAL LOW (ref 4.40–5.90)
RDW: 18.9 % — ABNORMAL HIGH (ref 11.5–14.5)
WBC: 11.2 10*3/uL — ABNORMAL HIGH (ref 3.8–10.6)

## 2012-12-11 LAB — MAGNESIUM: Magnesium: 1.3 mg/dL — ABNORMAL LOW

## 2012-12-12 LAB — BASIC METABOLIC PANEL
Anion Gap: 7 (ref 7–16)
BUN: 1 mg/dL — ABNORMAL LOW (ref 7–18)
Creatinine: 0.67 mg/dL (ref 0.60–1.30)
EGFR (Non-African Amer.): >60
Glucose: 118 mg/dL — ABNORMAL HIGH (ref 65–99)
Osmolality: 280 (ref 275–301)

## 2012-12-12 LAB — BETA STREP CULTURE(ARMC)

## 2012-12-12 LAB — CBC WITH DIFFERENTIAL/PLATELET
Basophil #: 0.1 10*3/uL (ref 0.0–0.1)
Basophil %: 0.7 %
Eosinophil %: 4.9 %
HCT: 28.5 % — ABNORMAL LOW (ref 40.0–52.0)
HGB: 8.9 g/dL — ABNORMAL LOW (ref 13.0–18.0)
MCH: 24.7 pg — ABNORMAL LOW (ref 26.0–34.0)
MCHC: 31.1 g/dL — ABNORMAL LOW (ref 32.0–36.0)
MCV: 80 fL (ref 80–100)
Monocyte #: 1.8 x10 3/mm — ABNORMAL HIGH (ref 0.2–1.0)
Neutrophil %: 53.2 %
RDW: 18.9 % — ABNORMAL HIGH (ref 11.5–14.5)
WBC: 11.6 10*3/uL — ABNORMAL HIGH (ref 3.8–10.6)

## 2012-12-14 LAB — URINE CULTURE

## 2012-12-15 LAB — CULTURE, BLOOD (SINGLE)

## 2013-01-02 ENCOUNTER — Ambulatory Visit: Payer: Self-pay | Admitting: Internal Medicine

## 2013-01-13 ENCOUNTER — Inpatient Hospital Stay: Payer: Self-pay | Admitting: Internal Medicine

## 2013-01-13 LAB — CBC
HCT: 21.4 % — ABNORMAL LOW (ref 40.0–52.0)
MCH: 28.3 pg (ref 26.0–34.0)
MCHC: 31.9 g/dL — ABNORMAL LOW (ref 32.0–36.0)
MCV: 89 fL (ref 80–100)
Platelet: 752 10*3/uL — ABNORMAL HIGH (ref 150–440)
RBC: 2.41 10*6/uL — ABNORMAL LOW (ref 4.40–5.90)
WBC: 15.2 10*3/uL — ABNORMAL HIGH (ref 3.8–10.6)

## 2013-01-13 LAB — URINALYSIS, COMPLETE
Glucose,UR: NEGATIVE mg/dL (ref 0–75)
RBC,UR: 196 /HPF (ref 0–5)
Specific Gravity: 1.016 (ref 1.003–1.030)
Squamous Epithelial: 14
WBC UR: 1438 /HPF (ref 0–5)

## 2013-01-13 LAB — COMPREHENSIVE METABOLIC PANEL
Anion Gap: 10 (ref 7–16)
Bilirubin,Total: 0.2 mg/dL (ref 0.2–1.0)
Chloride: 110 mmol/L — ABNORMAL HIGH (ref 98–107)
EGFR (Non-African Amer.): >60
Glucose: 102 mg/dL — ABNORMAL HIGH (ref 65–99)
SGOT(AST): 15 U/L (ref 15–37)
Total Protein: 6.5 g/dL (ref 6.4–8.2)

## 2013-01-14 LAB — CBC WITH DIFFERENTIAL/PLATELET
Basophil: 1 %
HCT: 28.1 % — ABNORMAL LOW (ref 40.0–52.0)
HGB: 9.1 g/dL — ABNORMAL LOW (ref 13.0–18.0)
Lymphocytes: 21 %
MCH: 28.3 pg (ref 26.0–34.0)
MCHC: 32.5 g/dL (ref 32.0–36.0)
Segmented Neutrophils: 64 %
Variant Lymphocyte - H1-Rlymph: 1 %
WBC: 12.1 10*3/uL — ABNORMAL HIGH (ref 3.8–10.6)

## 2013-01-14 LAB — COMPREHENSIVE METABOLIC PANEL
Bilirubin,Total: 0.2 mg/dL (ref 0.2–1.0)
Chloride: 115 mmol/L — ABNORMAL HIGH (ref 98–107)
EGFR (Non-African Amer.): >60
Osmolality: 280 (ref 275–301)
SGOT(AST): 12 U/L — ABNORMAL LOW (ref 15–37)
Sodium: 142 mmol/L (ref 136–145)
Total Protein: 5.4 g/dL — ABNORMAL LOW (ref 6.4–8.2)

## 2013-01-14 LAB — CLOSTRIDIUM DIFFICILE BY PCR

## 2013-01-15 LAB — MAGNESIUM: Magnesium: 1.9 mg/dL

## 2013-01-16 LAB — CBC WITH DIFFERENTIAL/PLATELET
Basophil %: 0.9 %
HCT: 29.1 % — ABNORMAL LOW (ref 40.0–52.0)
HGB: 9.6 g/dL — ABNORMAL LOW (ref 13.0–18.0)
Lymphocyte #: 3.1 10*3/uL (ref 1.0–3.6)
MCH: 28.6 pg (ref 26.0–34.0)
MCV: 87 fL (ref 80–100)
Monocyte %: 10.4 %
RBC: 3.34 10*6/uL — ABNORMAL LOW (ref 4.40–5.90)
RDW: 18.9 % — ABNORMAL HIGH (ref 11.5–14.5)
WBC: 8.7 10*3/uL (ref 3.8–10.6)

## 2013-01-16 LAB — BASIC METABOLIC PANEL
BUN: 5 mg/dL — ABNORMAL LOW (ref 7–18)
Chloride: 115 mmol/L — ABNORMAL HIGH (ref 98–107)
Creatinine: 0.52 mg/dL — ABNORMAL LOW (ref 0.60–1.30)
EGFR (African American): >60
EGFR (Non-African Amer.): >60
Glucose: 83 mg/dL (ref 65–99)
Osmolality: 280 (ref 275–301)
Sodium: 142 mmol/L (ref 136–145)

## 2013-01-18 LAB — CULTURE, BLOOD (SINGLE)

## 2013-01-26 ENCOUNTER — Other Ambulatory Visit: Payer: Self-pay | Admitting: Internal Medicine

## 2013-01-26 LAB — CBC WITH DIFFERENTIAL/PLATELET
Basophil %: 1.2 %
Eosinophil #: 0.4 10*3/uL (ref 0.0–0.7)
Eosinophil %: 3 %
HCT: 31.9 % — ABNORMAL LOW (ref 40.0–52.0)
Lymphocyte #: 3.1 10*3/uL (ref 1.0–3.6)
Lymphocyte %: 21.8 %
MCH: 28.3 pg (ref 26.0–34.0)
MCHC: 31.9 g/dL — ABNORMAL LOW (ref 32.0–36.0)
MCV: 89 fL (ref 80–100)
Monocyte #: 1.4 x10 3/mm — ABNORMAL HIGH (ref 0.2–1.0)
Neutrophil #: 9 10*3/uL — ABNORMAL HIGH (ref 1.4–6.5)
Neutrophil %: 64 %
Platelet: 641 10*3/uL — ABNORMAL HIGH (ref 150–440)
RBC: 3.6 10*6/uL — ABNORMAL LOW (ref 4.40–5.90)
RDW: 18.3 % — ABNORMAL HIGH (ref 11.5–14.5)

## 2013-01-26 LAB — URINALYSIS, COMPLETE
Ketone: NEGATIVE
Nitrite: NEGATIVE
RBC,UR: 27 /HPF (ref 0–5)
Specific Gravity: 1.013 (ref 1.003–1.030)
Squamous Epithelial: 1

## 2013-01-26 LAB — BASIC METABOLIC PANEL
Calcium, Total: 8.8 mg/dL (ref 8.5–10.1)
Chloride: 109 mmol/L — ABNORMAL HIGH (ref 98–107)
Creatinine: 0.68 mg/dL (ref 0.60–1.30)
EGFR (African American): >60
EGFR (Non-African Amer.): >60
Glucose: 136 mg/dL — ABNORMAL HIGH (ref 65–99)
Osmolality: 285 (ref 275–301)
Sodium: 141 mmol/L (ref 136–145)

## 2013-01-28 LAB — CULTURE, BLOOD (SINGLE)

## 2013-02-01 LAB — URINE CULTURE

## 2013-03-02 ENCOUNTER — Other Ambulatory Visit: Payer: Self-pay | Admitting: Internal Medicine

## 2013-03-02 LAB — CBC WITH DIFFERENTIAL/PLATELET
Basophil #: 0.1 10*3/uL (ref 0.0–0.1)
HGB: 11.1 g/dL — ABNORMAL LOW (ref 13.0–18.0)
Lymphocyte #: 3.2 10*3/uL (ref 1.0–3.6)
Lymphocyte %: 36.3 %
MCHC: 32.5 g/dL (ref 32.0–36.0)
MCV: 88 fL (ref 80–100)
Monocyte #: 1.3 x10 3/mm — ABNORMAL HIGH (ref 0.2–1.0)
Neutrophil #: 3.8 10*3/uL (ref 1.4–6.5)
Platelet: 477 10*3/uL — ABNORMAL HIGH (ref 150–440)

## 2013-03-02 LAB — BASIC METABOLIC PANEL
BUN: 13 mg/dL (ref 7–18)
Calcium, Total: 9.3 mg/dL (ref 8.5–10.1)
Co2: 12 mmol/L — ABNORMAL LOW (ref 21–32)
EGFR (Non-African Amer.): >60
Osmolality: 285 (ref 275–301)
Potassium: 3 mmol/L — ABNORMAL LOW (ref 3.5–5.1)

## 2013-04-04 DEATH — deceased

## 2013-05-30 IMAGING — CR DG CHEST 1V PORT
1 series · 1 of 1 positions shown · non-contrast
Comparison: none

REASON FOR EXAM: sepsis
COMMENTS:

PROCEDURE:     DXR - DXR PORTABLE CHEST SINGLE VIEW  - November 28, 2012 [DATE]
RESULT:     Comparison: 03/05/2012

[ap]
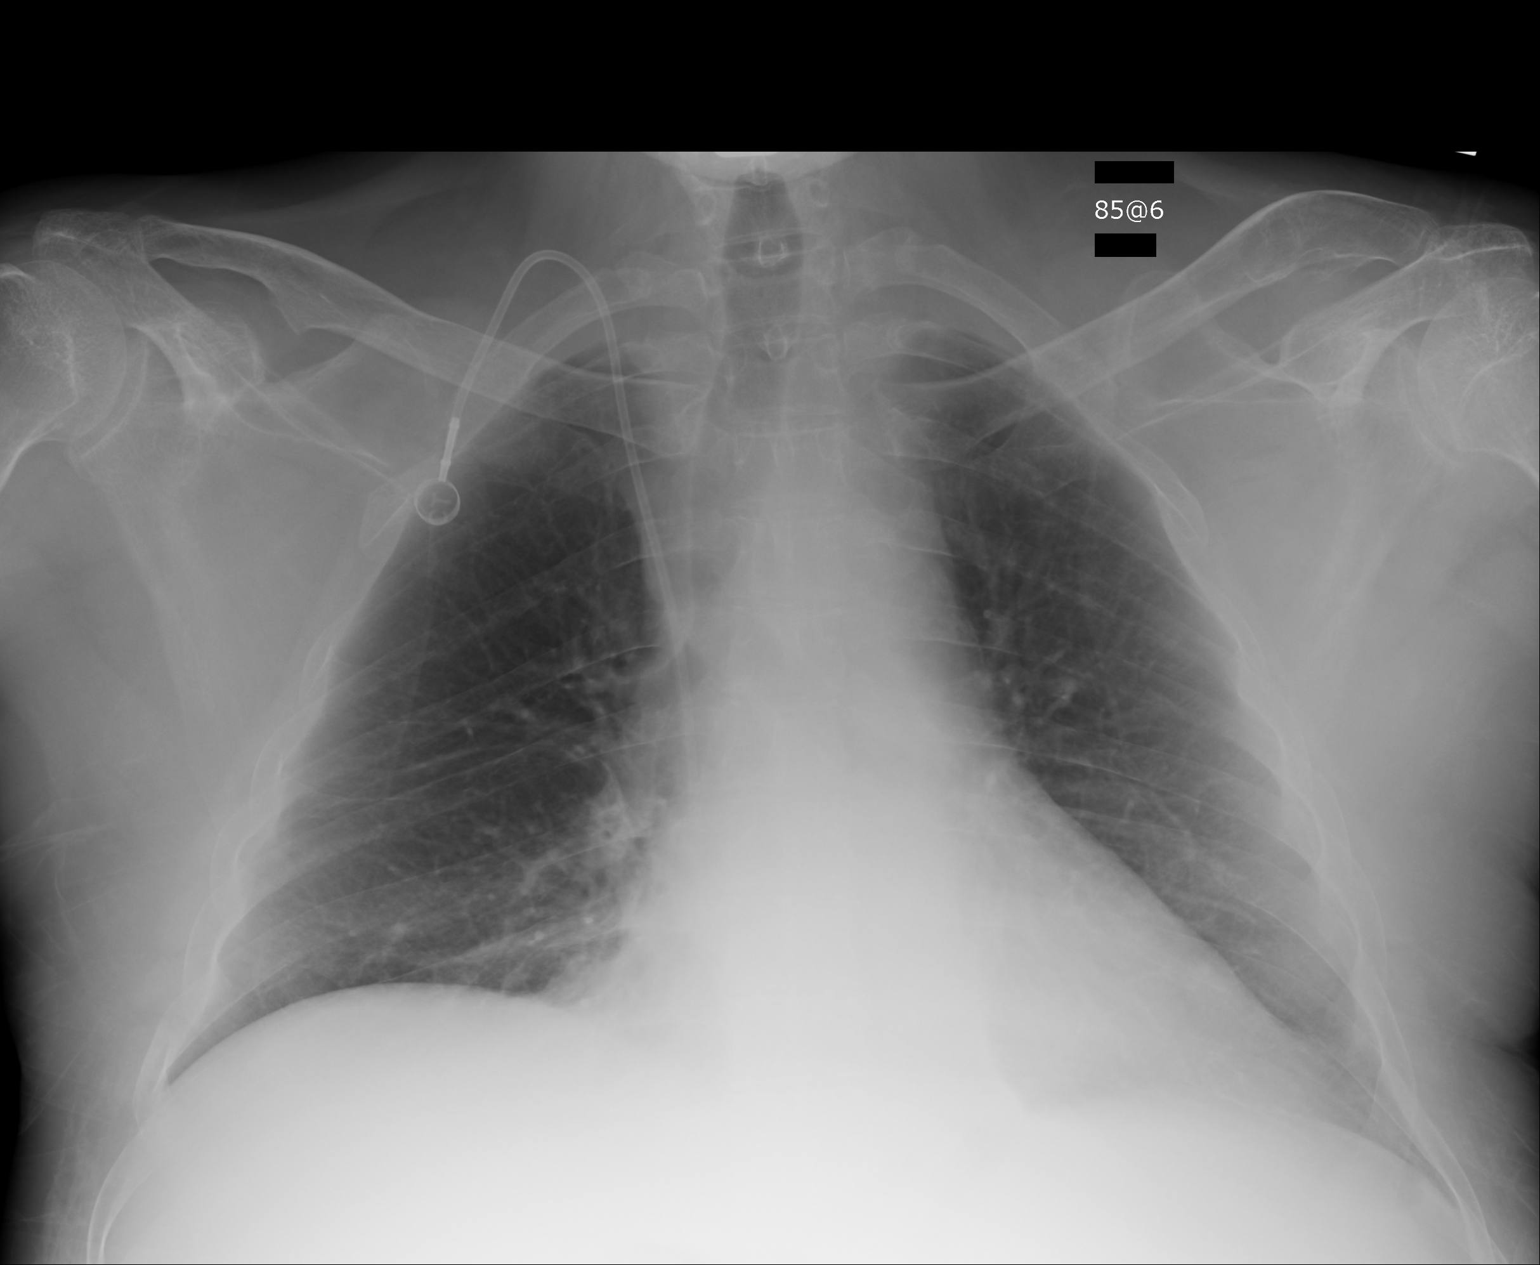

[1 of 1 positions shown; findings below may reference images not displayed]

FINDINGS: Right IJ portacatheter tip terminates over the cavoatrial junction. The
heart and mediastinum are stable. Mild right infrahilar opacities are likely
secondary to atelectasis.
IMPRESSION: Mild right infrahilar opacities are likely secondary to atelectasis.
Infection or aspiration are not excluded. Followup PA and lateral chest
radiograph is recommended.

[REDACTED]

## 2013-06-02 IMAGING — XA IR VASCULAR PROCEDURE
1 series · 1 of 1 positions shown · non-contrast
Comparison: none

[Series 4: single · 1 of 1 slices shown]
[im 1/1]
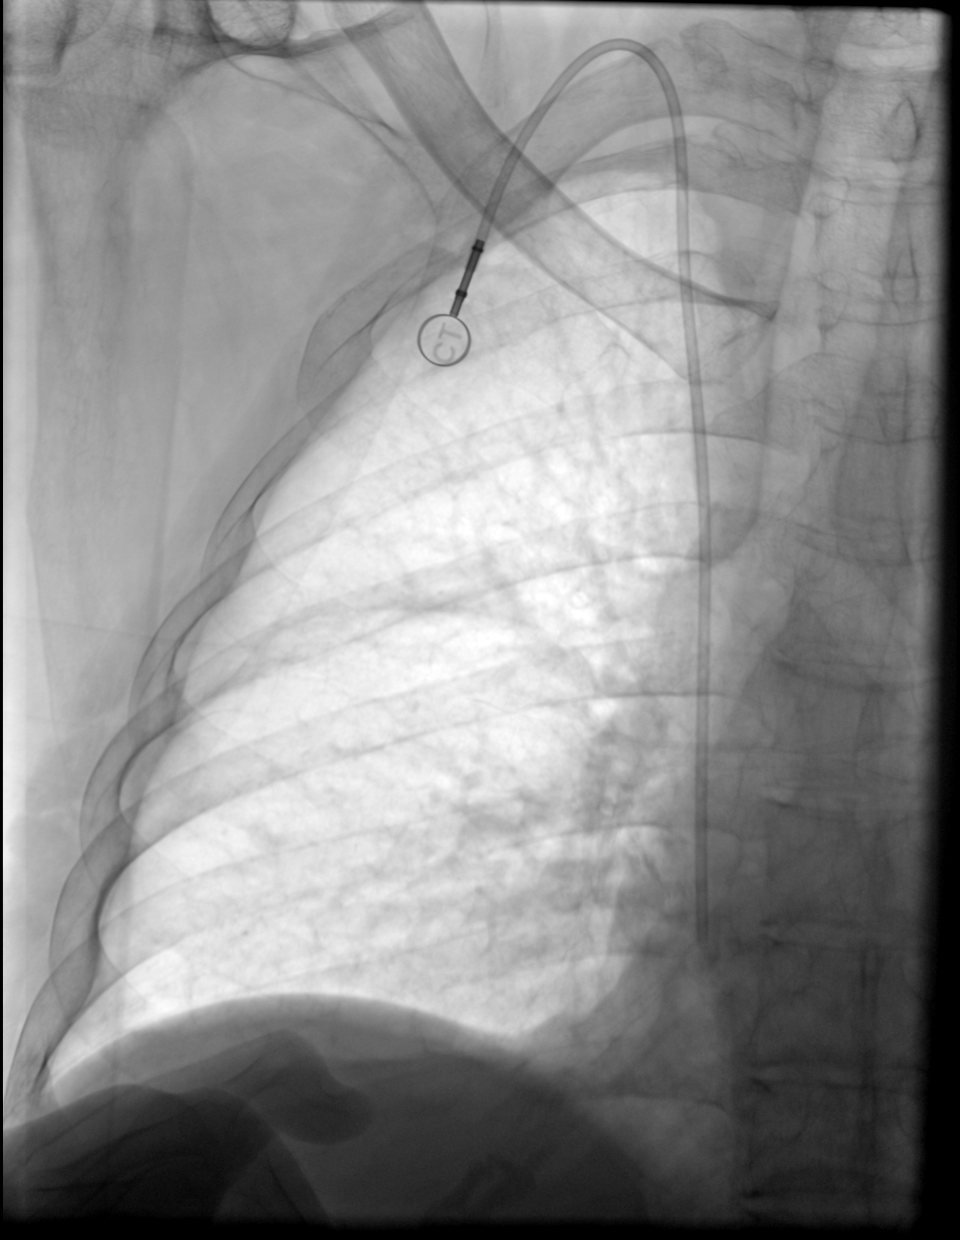

[1 of 1 positions shown; findings below may reference images not displayed]

IMAGES IMPORTED FROM THE [employer] WORKFLOW SYSTEM
NO DICTATION FOR STUDY

## 2013-07-15 IMAGING — CR DG CHEST 1V PORT
1 series · 2 of 2 positions shown · non-contrast
Comparison: none

REASON FOR EXAM: sepsis
COMMENTS:

[Series 1: ap · 0.17mm/px · 2 of 2 slices shown]
[im 1/2]
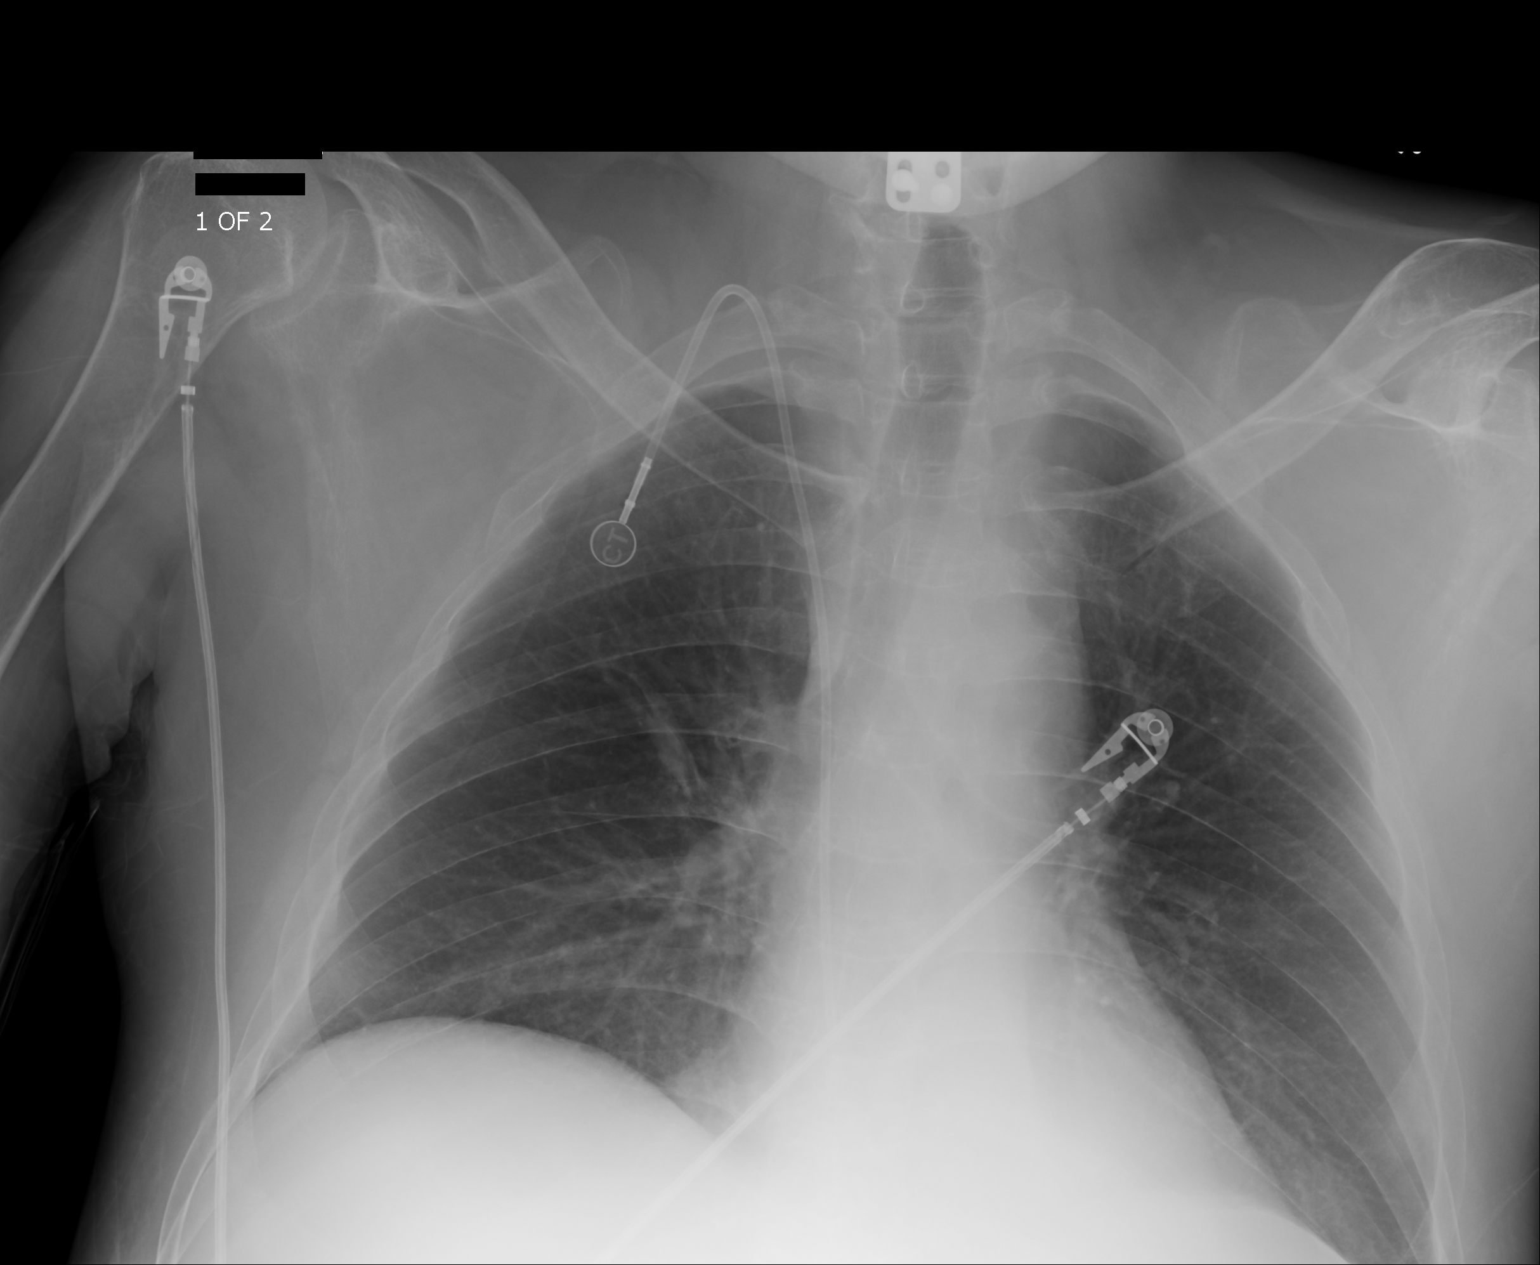
[im 2/2]
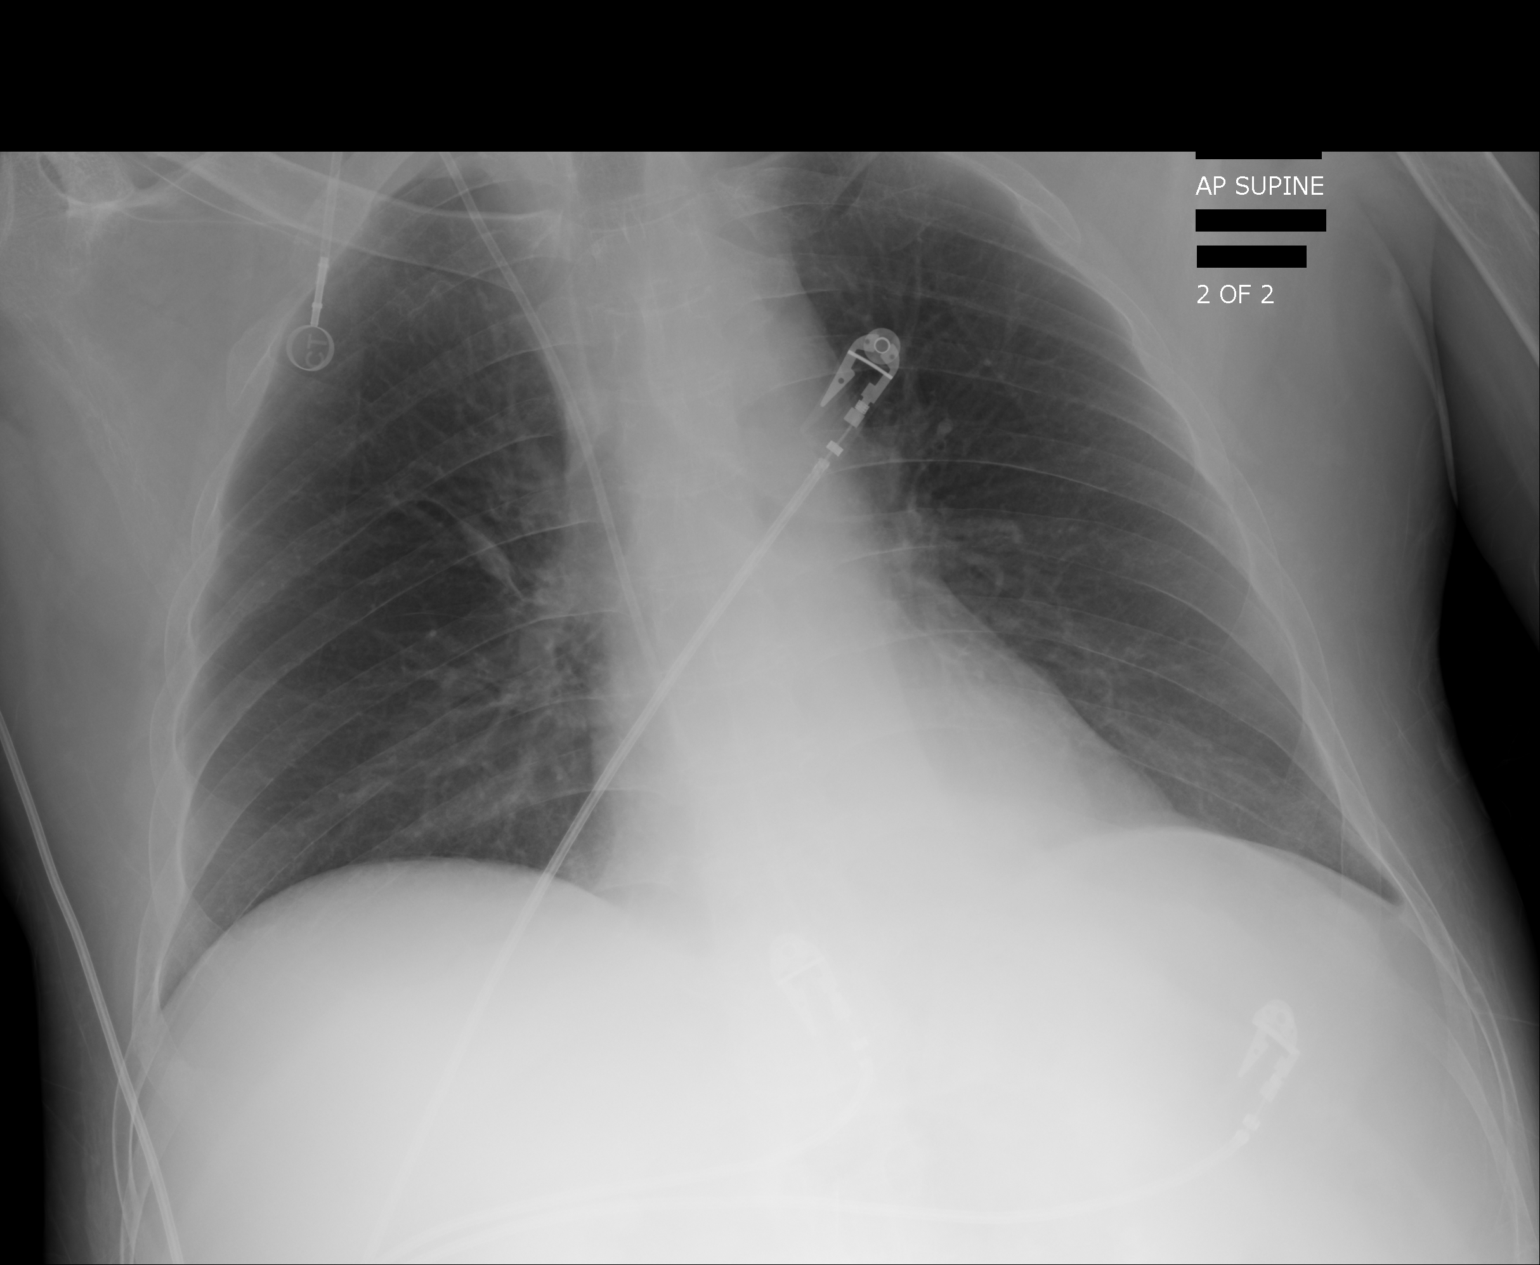

[2 of 2 positions shown; findings below may reference images not displayed]

PROCEDURE:     DXR - DXR PORTABLE CHEST SINGLE VIEW  - January 13, 2013  [DATE]

RESULT:     Comparison is made to the study of 12/09/2012.

Right-sided Port-A-Cath device is present with the tip of the catheter in
the right atrium. The lungs are clear. The heart and pulmonary vessels are
normal. The bony and mediastinal structures are unremarkable. There is no
effusion. There is no pneumothorax or evidence of congestive failure.
IMPRESSION: No acute cardiopulmonary disease.

[REDACTED]

## 2014-10-22 NOTE — Discharge Summary (Signed)
PATIENT NAME:  [name] [name], [name] [name] MR#:  [id number] DATE OF BIRTH:  October 18, 1965  DATE OF ADMISSION:  03/05/2012 DATE OF DISCHARGE:  03/08/2012  For a detailed note, please see the History and Physical done on admission by Dr. Imogene Burn.   DIAGNOSES AT DISCHARGE:  1. Complicated urinary tract infection, although now ruled out.  2. Chronic quadriplegia.  3. Fever of unknown origin.  4. Multiple sacral decubitus ulcers. 5. Chronic pain.  6. Hypokalemia.   DIET: The patient is being discharged on a regular diet.   ACTIVITY: As tolerated.   FOLLOWUP: Follow up with Dr. Maryellen Pile at the skilled nursing facility in the next 1 to 2 weeks.   DISCHARGE MEDICATIONS: 1. Geri-Tussin  10 mL q. 6 hours as needed for cough.  2. Milk of Magnesia 30 mL as needed for constipation.  3. Promethazine 25 mg intramuscular q. 4 hours as needed.  4. Promethazine 25 mg orally every four hours as needed for nausea, vomiting.  5. Neurontin 300 mg  q.i.d.  6. Align 4 mg daily.  7. Zofran 4 mg sublingual q. 4 hours as needed.  8. Protonix 40 mg b.i.d.  9. Reglan 10 mg q.i.d.  10. Senokot 2 tabs daily as needed for constipation.  11. Senokot 1 tab b.i.d. for constipation.  12. Zofran 2 mL intramuscular or sublingual every four hours as needed.  13. Cymbalta 60 mg daily.  14. Cepacol lozenge q. 2 hours as needed.  15. Eucerin cream to be applied to the legs bilaterally.  16. Multivitamins daily.  17. Vitamin D3 50,000 international units monthly. 18. Megace 10 mL b.i.d.  19. OxyContin 10 mg t.i.d.  20. Percocet 1 tab every four hours as needed for pain.   CONSULTANTS DURING THE HOSPITAL COURSE:  Wound consult with Glee Arvin.   PERTINENT STUDIES DONE DURING THE HOSPITAL COURSE:  1. CT scan of the abdomen and pelvis done without contrast showing chronic changes of the kidneys. No evidence of any new obstructive inflammatory changes. Large amount of stool in the rectosigmoid colon, small left effusion. 2. Chest x-ray  showing no acute cardiopulmonary disease.   BRIEF HOSPITAL COURSE: This is a 49 year old male with medical problems as mentioned above who presented to the hospital on 09/01 secondary to nausea and vomiting for four days.  1. Nausea and vomiting: The patient initially was thought to have nausea and vomiting secondary to a urinary tract infection as he had abnormal urinalysis. The patient has a previous history of complicated UTIs given chronic indwelling Foley. He has had ESBL and Klebsiella in the past. Therefore, empirically the patient was started on IV Zyvox and Zosyn. He was quickly changed over to meropenem. The patient's abdominal pain and nausea and vomiting since then have improved. His urine cultures were consistent with contamination and therefore unlikely a urinary tract infection. His symptoms improved just with supportive care. Currently he has had no episodes of nausea or vomiting over the past 48 hours.  2. Fever of unknown origin: As per the Emergency Room, the patient did have a fever, although it is not documented. He has had no fevers since he has been in the hospital. I took him off antibiotics about 48 hours ago and he has had no recurrence of his fevers.  He has had a normal white cell count. The source of his fever is still unclear. The patient was also seen by the wound team due to his chronic sacral decubitus ulcers and also pressure sores on his  lower extremities. As per them these wounds are not infected. He will continue local wound care as per the wound care team for now.  3. Chronic sacral decubitus ulcers secondary to being quadriplegic and being bedbound: As mentioned, the patient was seen by the wound team who did not think that the ulcers were infected. They changed his dressings and we will continue local wound care with calcium alginate and ABD pads with secure tape.  The wound  dressings do need to be changed on a daily basis.  The patient will continue followup with the  [facility] as the patient is well known to them.  4. Chronic pain: The patient was maintained on his OxyContin and Percocet. He will continue that.  5. Hypokalemia: The patient was given some potassium supplements and likely needs to have his potassium checked in next couple of days to make sure it is improving.  6. Depression: The patient was maintained on his Cymbalta which he will continue.  7. Gastroesophageal reflux disease: The patient was maintained on his Protonix and will continue that.  8. Failure to thrive: The patient was on Megace supplements and he will continue that.  9. CODE STATUS: The patient is a FULL CODE.  TIME SPENT ON DISCHARGE: 40 minutes.   ____________________________ Rolly PancakeVivek J. Cherlynn KaiserSainani, MD vjs:bjt D: 03/08/2012 14:17:58 ET T: 03/08/2012 14:42:17 ET JOB#: [id number]  cc: Rolly PancakeVivek J. Cherlynn KaiserSainani, MD, <Dictator> Serita ShellerErnest B. Maryellen PileEason, MD Houston SirenVIVEK J SAINANI MD ELECTRONICALLY SIGNED 03/08/2012 15:57

## 2014-10-22 NOTE — H&P (Signed)
PATIENT NAME:  [name] [name], [name] [name] MR#:  [id number] DATE OF BIRTH:  1965-08-14  DATE OF ADMISSION:  03/05/2012  REFERRING PHYSICIAN: Dorothea Glassman, MD  PRIMARY CARE PHYSICIAN: Cay Schillings, MD  CHIEF COMPLAINT: Nausea and vomiting for four days.   HISTORY OF PRESENT ILLNESS: The patient is a 49 year old African American male with a history of C5-C7 injury with quadriplegia, neurogenic bladder requiring chronic Foley catheter, and recent Pseudomonas Klebsiella urinary tract infection who presented to the ED with nausea and vomiting for the past four days. In addition, the patient complains of fever, chills, and cloudy urine. The patient said the Foley catheter was just changed last night. The patient's urinalysis showed WBCs, more than 600. He was treated with Rocephin in the ED and admitted for urinary tract infection. The patient denies any headache or dizziness, no chest pain or palpitations, and no abdominal pain or diarrhea, but has constipation.   PAST MEDICAL HISTORY:  1. C5-C7 injury with quadriplegia and neurogenic bladder requiring chronic Foley catheter with colonization of bacteria, recent Pseudomonas Klebsiella urinary tract infection.  2. Sacral decubitus. 3. Chronic bilateral hydronephrosis with ureteral stones. 4. Diverting colostomy. 5. History of MRSA infection   PAST SURGICAL HISTORY: Colostomy.  SOCIAL HISTORY: The patient has a history of smoking half pack a day; no drug abuse or alcohol. The patient lives in [facility] nursing home.   FAMILY HISTORY: Unknown.  REVIEW OF SYSTEMS: CONSTITUTIONAL: The patient has a fever and chills, but no headache, dizziness, or weakness. EYES: No double vision or blurred vision. ENT: No epistaxis, postnasal drip, slurred speech, or dysphagia. RESPIRATORY: No cough, sputum, shortness of breath, or hematemesis. CARDIOVASCULAR: No chest pain, palpitation, orthopnea, or nocturnal dyspnea. No leg edema. PULMONARY: GASTROINTESTINAL: No  abdominal pain or diarrhea, but has nausea and vomiting. No melena or bloody stools. GENITOURINARY: No dysuria or hematuria. Chronic Foley catheter due to neurogenic bladder. ENDOCRINE: No heat or cold intolerance. HEMATOLOGY: No easy bruising or bleeding. NEUROLOGY: No syncope, loss of consciousness, or seizure. SKIN: No rash or jaundice. PSYCH: No depression or anxiety.   ALLERGIES: Levaquin and vancomycin.  MEDICATIONS: Please refer to the home medication list.   PHYSICAL EXAMINATION:   VITAL SIGNS: Temperature 97.4. No other vital signs yet.  GENERAL: The patient is alert, awake, and oriented, in no acute distress.   HEENT: Pupils are round, equal, and reactive to light and accommodation. Moist oral mucosa. Clear oropharynx.   NECK: Supple. No JVD or carotid bruits. No lymphadenopathy. No thyromegaly.   CARDIOVASCULAR: S1 and S2 regular rate and rhythm. No murmurs or gallops.   PULMONARY: Bilateral air entry. No wheezing or rales.   ABDOMEN: Soft. No distention. No tenderness. No organomegaly. Status post colostomy. No diarrhea.   EXTREMITIES: The patient is quadriplegic.   SKIN: No rash or jaundice but has heaving scars on bilateral lower extremities.   LABORATORY, DIAGNOSTIC AND RADIOLOGIC DATA: WBC 13.6, hemoglobin 12.0, and platelet 555. Glucose 65, BUN 10, and creatinine 0.6. Electrolytes normal.   Urinalysis shows WBCs 659, RBC 113, and nitrite negative.   CAT scan of abdomen and pelvis without contrast shows chronic changes, no evidence of acute obstruction or inflammatory disease, a large amount of stool in the rectosigmoid colon, and small left effusion.   IMPRESSION:  1. Urinary tract infection.  2. Leukocytosis.  3. Neurogenic bladder.  4. Anemia.  5. Quadriplegia. 6. Constipation.  PLAN OF TREATMENT: The patient will be admitted to the medical floor. We will start contact isolation  and start Zosyn and Zyvox and follow-up urine culture and CBC. We will give  Zofran and Phenergan p.r.n. for nausea and vomiting. We will give milk of magnesia for constipation p.r.n.  ____________________________ Shaune PollackQing Valentina Alcoser, MD qc:slb D: 03/05/2012 16:07:02 ET T: 03/06/2012 08:18:59 ET JOB#: [id number]  cc: Shaune PollackQing Noemi Bellissimo, MD, <Dictator> Mickie HillierJack H. Sheppard PentonWolf, MD Shaune PollackQING Symia Herdt MD ELECTRONICALLY SIGNED 03/06/2012 13:54

## 2014-10-22 NOTE — Op Note (Signed)
PATIENT NAME:  [name] [name], [name] [name] MR#:  [id number] DATE OF BIRTH:  1966-03-28  DATE OF PROCEDURE:  01/31/2012  PREOPERATIVE DIAGNOSES:   1. Paraplegia. 2. Chronic multi-drug resistant urinary tract infection.  3. 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   POSTOPERATIVE DIAGNOSES: 1. Paraplegia. 2. Chronic multi-drug resistant urinary tract infection.  3. 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   PROCEDURES:  1. Ultrasound guidance for vascular access to right brachial vein.  2. Fluoroscopic guidance for placement of catheter.  3. Insertion of peripherally inserted central venous catheter, right arm.  SURGEON: Festus BarrenJason Zachari Alberta, MD  ANESTHESIA: Local.   ESTIMATED BLOOD LOSS: Minimal.   INDICATION FOR PROCEDURE: This is a 49 year old individual with multi-drug resistant organism requiring IV therapy in a nursing facility with limited venous access. A PICC line is requested.  DESCRIPTION OF PROCEDURE: The patient's right arm was sterilely prepped and draped, and a sterile surgical field was created. The right brachial vein was accessed under direct ultrasound guidance without difficulty with a micropuncture needle and permanent image was recorded. 0.018 wire was then placed into the superior vena cava. Peel-away sheath was placed over the wire. A single lumen peripherally inserted central venous catheter was then placed over the wire and the wire and peel-away sheath were removed. The catheter tip was placed into the     superior vena cava and was secured at the skin at 38 cm with a sterile dressing. The catheter withdrew blood well and flushed easily with heparinized saline. The patient tolerated procedure well. ____________________________ Annice NeedyJason S. Elliotte Marsalis, MD jsd:slb D: 01/31/2012 10:55:32 ET T: 01/31/2012 13:43:42 ET JOB#: [id number]  cc: Annice NeedyJason S. Tabias Swayze, MD, <Dictator> Annice NeedyJASON S Frantz Quattrone MD ELECTRONICALLY SIGNED 02/03/2012 13:52

## 2014-10-25 NOTE — Consult Note (Signed)
CC: pt has C. diff positive in his stool. will start Flagyl 500mg  qid.  Electronic Signatures: Scot JunElliott, Charlena Haub T (MD)  (Signed on 08-Jun-14 20:29)  Authored  Last Updated: 08-Jun-14 20:29 by Scot JunElliott, Ermagene Saidi T (MD)

## 2014-10-25 NOTE — Consult Note (Signed)
Brief Consult Note: Diagnosis: Nephrolithiasis, Chronic bilateral hydronephrosis, Neurogenic bladder, R ureteral stone.   Patient was seen by consultant.   Consult note dictated.   Recommend further assessment or treatment.   Discussed with Attending MD.   Comments: - cont. medical management - change foley - if fails to improve or woresens clinically, could consider PCN vs. stent,  would favor PCN in settting of his neurogenic bladder and chronic hydro.  Electronic Signatures: Charyl BiggerHouser, Bomani Oommen Ross (MD)  (Signed 08-Jun-14 12:22)  Authored: Brief Consult Note   Last Updated: 08-Jun-14 12:22 by Charyl BiggerHouser, Cher Egnor Ross (MD)

## 2014-10-25 NOTE — Consult Note (Signed)
Impression: 49yo male w/ h/o quadraplegia, recurrent UTI, chronic pressure ulcers admitted with C. diff colitis.  He has had multiple admissions in the past for UTI, but it is very difficult to interpret urine culture data in the setting of quadraplegia and chronic catheterization.  These patients have chronic colonization of the bladder with organisms that are often highly resistant.  His BCx, CXR do not show an obvious infection.source.  His urine culture is growing E. coli and Pseudomonas.Marland Kitchen.   He was recently hospitalized last week for malaise.  His work up was unrevealing.  That may have been C. diff colitis at that time.  Alternately, he recieved broad spectrum antibiotics and could have developed C. diff infection subsequently. Agree with metronidazole.  Would treat for 10-14 days. 4)         Would avoid using other antibiotics at this time. 5)         Would be reluctant to use empiric antibiotics in the future, unless he was obviously ill.  A positive u/a or culture alone would not be sufficient evidence of infection in this patient.   Electronic Signatures: Ebelyn Bohnet MPH, Rosalyn GessMichael E (MD) (Signed on 10-Jun-14 12:41)  Authored   Last Updated: 10-Jun-14 12:50 by Shishir Krantz MPH, Rosalyn GessMichael E (MD)

## 2014-10-25 NOTE — Consult Note (Signed)
Brief Consult Note: Diagnosis: Sepsis, lack of IV access.   Patient was seen by consultant.   Recommend to proceed with surgery or procedure.   Comments: will plan to revise the port.  Electronic Signatures: Levora DredgeSchnier, Johniya Durfee (MD)  (Signed 30-May-14 19:23)  Authored: Brief Consult Note   Last Updated: 30-May-14 19:23 by Levora DredgeSchnier, Noah Pelaez (MD)

## 2014-10-25 NOTE — Op Note (Signed)
PATIENT NAME:  [name], [name]:  [id number] DATE OF BIRTH:  01/24/1966  DATE OF PROCEDURE:  12/01/2012  PREOPERATIVE DIAGNOSES:  1. Complication, vascular device.  2. Urosepsis.   POSTOPERATIVE DIAGNOSES:  1. Complication, vascular device.  2. Urosepsis.   PROCEDURE PERFORMED: Revision of right internal jugular Infuse-a-Port, same venous access.   SURGEON: Renford DillsGregory G. Arminda Foglio, M.D.   SEDATION: Versed 6 mg.   CONTRAST USED: None.   FLUOROSCOPY TIME: 0.4 minutes.   INDICATIONS: [name]. [name] is a 49 year old gentleman with multiple medical problems stemming from his quadriplegia. He presented with very resistant Pseudomonas urinary tract infection and signs and symptoms consistent with sepsis. He is a venous access problem and has had a port placed because of this. [facility] it was noted his port does not aspirate or flush.   DESCRIPTION OF PROCEDURE: The patient is brought to special procedures and placed in the supine position. After adequate sedation is achieved with Versed as the patient had eaten, his neck and chest wall are prepped and draped in sterile fashion. Port is accessed, and indeed, it is confirmed that it does not aspirate or flush. Lidocaine 1% is infiltrated in the soft tissues and the previous incisional scar is re-incised with an 11 blade scalpel. The port is then exposed, removed. The catheter is transected, and a wire is advanced through the catheter under fluoroscopic guidance. The new port is then prepped on the field. The new catheter is trimmed somewhat so that it will fit over the wire. It is then advanced under fluoroscopic guidance so that the tip is into the inferior vena cava. Wire is removed. Under fluoroscopy, the tip is then positioned so that it is at the atriocaval junction. It is retrimmed. The new hub is connected and slipped back into the existing pocket. Percutaneous access with a Huber needle is then obtained. The port aspirates and flushes  easily. It is packed with heparinized saline, and the incision is closed with interrupted 3-0 Vicryl, followed by 4-0 Monocryl subcuticular and then Dermabond. The port is then accessed with a right-angle Huber needle, and a sterile dressing is applied. The patient tolerated the procedure well, and there were no immediate complications.    ____________________________ Renford DillsGregory G. Betzayda Braxton, MD ggs:gb D: 12/03/2012 15:12:33 ET T: 12/04/2012 03:02:20 ET JOB#: [id number]  cc: Renford DillsGregory G. Guila Owensby, MD, <Dictator> Renford DillsGREGORY G Alexica Schlossberg MD ELECTRONICALLY SIGNED 12/06/2012 11:04

## 2014-10-25 NOTE — Consult Note (Signed)
PATIENT NAME:  [name] [name], [name] [name] MR#:  [id number] DATE OF BIRTH:  [birth year]  DATE OF CONSULTATION: 12/10/2012  CONSULTING PHYSICIAN:  Manya Silvas, MD  REASON FOR CONSULTATION: The patient is a 49 year old black male with a history of quadriplegia. He also has a history of nausea, vomiting, generalized malaise and abdominal pain. I was asked to see him in consultation.   I saw the patient about a year ago and did an upper endoscopy for vomiting, and he had grade C esophagitis. He again has complaints of burning with swallowing and of nausea and vomiting   PAST MEDICAL HISTORY: 1.  C5 injury with quadriplegia.  2.  Neurogenic bladder.  3.  Recurrent urinary tract infections. 4.  Recent CAT scan showed stones in the kidneys and multiple stones in the urinary bladder.  5.  Sacral decubitus ulcers, stage IV.  6.  Chronic bilateral hydronephrosis with ureteral stones.  7.  History of diverting colostomy.   SOCIAL HISTORY: No current alcohol, tobacco or drug use. Resides at [facility].   ALLERGIES: LEVAQUIN AND VANCOMYCIN.   CURRENT MEDICATIONS: Promethazine 25 mg q. 4 p.r.n. Neurontin 300 mg 4 times a day, Align 4 mg a day, Protonix 40 mg twice a day, Zofran 4 mg injectable every 4 hours as needed for nausea, Cymbalta 60 mg a day, multivitamins with minerals daily, vitamin D3 at 50,000 units once a month, iron gluconate, Reglan 5 mg 4 times a day, OxyContin 10 mg twice a day, Senokot-S 2 tablets twice a day, folic acid 0.8 mg once a day, Percocet 5/325 one tablet every 4 hours as needed, milk of magnesia once a day and Zofran q. 4 p.r.n. sublingual.   PHYSICAL EXAMINATION: GENERAL: Black male in no acute distress.  VITAL SIGNS: Temp 97.5, pulse 92, respirations 20, blood pressure 108/71.  CHEST: Clear in anterolateral fields.   ABDOMEN: Ostomy present. Bowel sounds are diminished, but present. No peritoneal signs. No hepatosplenomegaly by percussion.   LABORATORY AND  RADIOLOGICAL DATA: Glucose 92, BUN 1, creatinine 0.57, sodium 139, potassium 3.1, chloride 113, CO2 of 17, calcium 7.9, total protein 7, albumin 1.7, total bilirubin 0.3, alk phos 116, SGOT 15, SGPT 9. White count 13.2, hemoglobin 9.7, platelet count 638, MCV 79, MCH 25.9. Blood cultures: No growth. Urine shows a large number of white cells and red cells, bacteria and white blood cell clumps.   ASSESSMENT AND RECOMMENDATIONS: Probably his vomiting is coming from urinary tract infections, although the question was raised whether or not he is having a true infection because of multiple organisms. I wonder if the presence of stones in his bladder would be the setting where multiple organisms could exist and cause infection, and I would consider a urology consult for this problem.   Because of his history of esophagitis on endoscopy a year ago, I do not think we need to repeat the test at this time. He did say he had spit up some reddish material that he thought was blood. I plan to start him on Carafate slurry 1 g about 20 minutes before meals to coat his esophagus.    ____________________________ Manya Silvas, MD rte:cb D: 12/10/2012 15:55:00 ET T: 12/10/2012 16:07:50 ET JOB#: [id number]  cc: Manya Silvas, MD, <Dictator>  Manya Silvas MD ELECTRONICALLY SIGNED 12/13/2012 7:17
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
## 2014-10-25 NOTE — Discharge Summary (Signed)
PATIENT NAME:  [name] [name], [name] [name] MR#:  [id number] DATE OF BIRTH:  December 08, 1965  DATE OF ADMISSION:  11/28/2012 DATE OF DISCHARGE:  12/04/2012  ADMITTING DIAGNOSIS: Urinary tract infection, possible sepsis.  DISCHARGE DIAGNOSES:  1.  Malnutrition, severe protein as well as calorie. 2.  Failure to thrive, adult.  3.  Malaise. 4.  C5 through C7 fracture with quadriplegia. 5.  Hypertension. 6.  Hypokalemia. 7.  Dehydration.  8.  Depression. 9.  Status post right internal jugular Port-A-Cath revision by Dr. Gilda CreaseSchnier on the 30th of May 2014.   10.  Neurogenic bladder with chronic indwelling Foley catheter.  11.  Sacral decubitus ulcers. 12.  History of hydronephrosis, ureteral stones, diverting colostomy, methicillin-resistant Staphylococcus aureus, Extended-spectrum beta-lactamase Escherichia coli infections in the past.  13.  History of tobacco abuse.   DISCHARGE CONDITION: Stable.   DISCHARGE MEDICATIONS: The patient is to resume his outpatient medications which are: 1.  Promethazine 25 mg 1 tablet every 4 hours as needed. 2.  Neurontin 300 mg p.o. 4 times daily. 3.  Align 4 mg once daily. 4.  Protonix 40 mg p.o. twice daily. 5.  Zofran 4 mg in 1 mL injectable solution, 2 mL intramuscular every 4 hours as needed.  6.  Cymbalta 60 mg p.o. daily.  7.  Multivitamins with minerals once daily.  8.  Vitamin D3 50,000 unit capsule once a month.  9.  Iron gluconate 27 mg twice a day.  10.  Reglan 5 mg 4 times daily.  11.  OxyContin 10 mg twice a day. 12.  UTI stat liquid 30 mL twice a day.  13.  Senna S 50/8.6 two tablets twice a day.  14.  Folic acid 0.8 mg once daily.  15.  Robitussin 100 mg in 5 mL oral liquid 10 mL every 6 hours as needed.  16.  Percocet 5/325 mg 1 tablet every 4 hours as needed.  17.  Milk of magnesia 8% oral suspension 30 mL once daily as needed.  18.  Zofran 4 mg oral tablet sublingually every 4 hours as needed.  19.  Cepacol lozenge 1 every 2 hours as needed.  20.   New medication, Remeron 15 mg p.o. daily at bedtime. 21.  Ammonium lactate topical ointment apply topically to affected area twice a day as needed for crusted dry skin.  22.  Juven 1 package daily.   DIET: 2 grams salt, low fat, low cholesterol, mechanical soft diet.   ACTIVITY LIMITATIONS: As tolerated.    DISCHARGE FOLLOWUP:  Appointment with Dr. Maryellen PileEason in 2 to 3 days after discharge.  CONSULTANTS: Dr. Leavy CellaBlocker, Dr. Michela PitcherEly, Dr. Levora DredgeGregory Schnier.   PROCEDURES:  Dr. Gilda CreaseSchnier did procedure on the 30th of May 2014 which is right internal jugular vein Port-A-Cath venous access revision, replacement.   RADIOLOGIC STUDIES: Chest, portable single view, on the 27th of May 2014, showed mild right infrahilar opacities which are likely secondary to atelectasis. Infection or aspiration are not excluded.  Follow-up PA and lateral chest x-rays were recommended. CT scan of right femur with IV contrast, 31st of May 2014, showed no well-circumscribed fluid collection. Ultrasound images showed poorly marginated fluid collection.  If fluid is infected was uncertain. Some fluid attenuation along the margins of deep soft tissue, deformity in inferior mid thigh region were noted, well-circumscribed abscess is not appreciated. Follow up to document stability versus resolution would be suggested with ultrasound if followup is desired, according to radiologist. Ultrasound of right lower extremity due to lower extremity  swelling, 30th of May 2014, showed no evidence of right lower extremity deep venous thrombosis, superficial fluid collection of right thigh at the sight of the patient's scar.  Correlate with clinical and laboratory data.  Appearance was nonspecific, according to radiologist. Abdomen flat and erect, 1st of June 2014, showed patchy infiltrate versus atelectasis at lung bases. No definite bowel obstruction or perforation noted.   HISTORY OF PRESENT ILLNESS: The patient is a [name] [name] with  history of quadriplegia who presented to the hospital with complaints of not feeling well as well as feeling feverish. Please refer to Dr. Serita Grit Patel's admission note on the 27th of May 2014.    On arrival to the Emergency Room, the patient's temperature was 98.3, pulse was 94, respiratory rate was 18, blood pressure 84/61 and saturation was 100% on room air. Physical examination was remarkable for stage IV decubitus ulcer and atrophic and distortion changes of his lower extremities and some swelling in the right lower extremity more than left lower extremity. The patient's chest x-ray was consistent with possible atelectasis.  The patient's lab data, done on admission, the 27th of May 2014, showed low sodium at 135, potassium 2.8, magnesium 1.7, otherwise BMP was unremarkable. Liver enzymes revealed albumin level of 1.8, otherwise unremarkable liver enzymes.  White blood cell count was elevated to 14.7, hemoglobin was 12.2 and  platelet count 755.  Absolute neutrophil count was elevated at 10.2.  Blood cultures taken on the 27th of May 2014 showed no growth. Urinalysis revealed yellow turbid urine, negative for glucose, bilirubin or ketones. Specific gravity 1.009. PH was 7.0, 2+ blood, 100 mg/dL protein,  negative for nitrites, 3+ leukocyte esterase, 79 red blood cells, 1118 white blood cells were found, 3+ bacteria, 15 epithelial cells and white blood cell clumps as well as budding yeast were present as well.   HOSPITAL COURSE:  The patient was admitted to the hospital with diagnosis of urinary tract infection, however, urine cultures came back as mixed bacterial organisms, results suggestive of contamination. Antibiotics were discontinued by Dr. Leavy Cella.  The patient was seen by Dr. Michela Pitcher for his sacral ulcers as well as lower extremity ulcerations. Dr. Michela Pitcher felt that the patient's ulcerations were well-scarred and clean.  No indication for surgical intervention. Lower extremity ulcerations looked however  as they were worsening and Dr. Michela Pitcher felt that this could be indication for bilateral lower extremity amputation if continues to progress.  The patient was seen by Dr. Gilda Crease who proceeded to take the patient to the operating room and change his Port-A-Cath for better IV access. That happened on the 30th of May 2014.  The patient was also evaluated and seen and followed by Dr. Leavy Cella who felt that the patient's malaise and leukocytosis is of unclear etiology. There was no obvious source for leukocytosis. The patient's blood cultures as well as urine did not appear to be infected. Dr. Leavy Cella recommended to discontinue all antibiotics. After that IV antibiotics were discontinued. The patient had no fevers. His vital signs were stable. He however had mild elevation of white blood cell count of unclear etiology. The patient's white blood cell count remained around 14.0, on the 2nd of June 2014. It is recommended to follow the patient's white blood cell count and make sure that it is improving.   In regards to low potassium as well as sodium level, suggested that the patient was dehydrated. He was also noted to be hypotensive. The patient was given IV fluids and with  this the patient's sodium as well as magnesium levels as well as potassium levels and blood pressure improved.  The patient was noted to be very picky with his oral intake. It was felt the patient's poor oral intake could have contributed to his malaise as well as weakness and hypotension and dehydration. It was felt that the patient very likely has significant depression and Remeron was initiated instead of Megace. The patient is receiving 15 mg of Remeron. It is recommended to follow the patient for interaction with duloxetine and serotonin syndrome.  It is also recommended to follow the patient's oral intake and see if this medication helps to improve his oral intake somewhat.  On the day of discharge, the patient however felt satisfactory, did not  complain of any significant discomfort. His vital signs were stable. He was able to drink and eat food.  He had good bowel movements in his colostomy patch and had no significant nausea or problems or abnormal pains. He is being discharged in stable condition with the above-mentioned medications and follow-up.  His vital signs on the day of discharge: Temperature is ranging from 98.4 to 99.1, pulse ranging from 86 to 103, respiration rate was 18 to 20, blood pressure 101 to 121 systolic and 60s to 80s diastolic. O2 sats were 94% to 99% on room air at rest. Of note, the patient was noted to have some atelectasis on his chest x-rays, however, since he was asymptomatic we did not initiate any therapy for this.  TIME SPENT: 40 minutes. ____________________________ Katharina Caper, MD rv:sb D: 12/04/2012 10:49:08 ET T: 12/04/2012 11:40:30 ET JOB#: [id number]  cc: Katharina Caper, MD, <Dictator> Serita Sheller. Maryellen Pile, MD Katharina Caper MD ELECTRONICALLY SIGNED 12/05/2012 21:31

## 2014-10-25 NOTE — H&P (Signed)
PATIENT NAME:  [name] [name], [name] [name] MR#:  [id number] DATE OF BIRTH:  1965/11/11  DATE OF ADMISSION:  12/09/2012  PRIMARY CARE PHYSICIAN: Alden Server B. Maryellen Pile, MD  CHIEF COMPLAINT: Nausea, vomiting, generalized malaise, abdominal pain.   HISTORY OF PRESENT ILLNESS: The patient is a 49 year old African American male with a history of C5 injury and quadriplegia who is a resident of [facility], has a history of neurogenic bladder with chronic indwelling Foley catheter, recurrent urinary tract infections with Pseudomonas, Klebsiella, ESBL E. coli, presenting with complaints of nausea, vomiting, malaise, abdominal pain for 1 week. He has not been febrile, but has been having chills and sweats. He was recently discharged from [facility] after treatment for urinary tract infection and possible sepsis. Cultures from that admission were mixed and not diagnostic. He was discharged off of antibiotics and presents today off of antibiotics.  PAST MEDICAL HISTORY:  1.  C5 injury with quadriplegia. 2.  Neurogenic bladder, chronic Foley catheter, recurrent urinary tract infections. 3.  Sacral decubitus ulcers, stage IV, followed by the wound care clinic. 4.  Chronic bilateral hydronephrosis with ureteral stones. 5.  History of diverting colostomy. 6.  History of recurrent urinary tract infections.  PAST SURGICAL HISTORY: Colon colostomy.  SOCIAL HISTORY: No current alcohol, tobacco, drug abuse. Has a history of smoking in the distant past. Currently residing at [facility] nursing home.  FAMILY HISTORY: Unknown.  ALLERGIES: LEVAQUIN, VANCOMYCIN.  CURRENT MEDICATIONS:  1.  Promethazine 25 mg 1 tablet every 4 hours as needed for nausea. 2.  Neurontin 300 mg 4 times a day. 3.  Align 4 mg once daily. 4.  Protonix 40 mg p.o. twice daily. 5.  Zofran 4 mg injectable every 4 hours as needed for nausea. 6.  Cymbalta 60 mg p.o. daily. 7.  Multivitamins with minerals daily. 8.   Vitamin D3, 50,000 units once a month. 9.  Iron gluconate 27 mg twice a day. 10.  Reglan 5 mg 4 times daily. 11.  OxyContin 10 mg twice a day. 12.  Senna-S 50/8.6, 2 tablets twice a day. 13.  Folic acid 0.8 mg once daily. 14.  Percocet 5/325, 1 tablet every 4 hours as needed. 15.  Milk of magnesia once a day as needed. 16.  Zofran 4 mg sublingually every 4 hours as needed.  REVIEW OF SYSTEMS:  GENERAL: Complains of weakness, fatigue, chills, generalized pain. HEENT: Denies change in vision, congestion, ear pain. Does complain of sore throat and sore mouth. RESPIRATORY: Denies shortness of breath, cough, wheezing. CARDIOVASCULAR: Denies chest pain. GASTROINTESTINAL: Complains of nausea, some hematemesis, abdominal pain. Unclear if bowel habits have changed. GENITOURINARY: Denies dysuria. Does have chronic Foley in place. Denies hematuria. SKIN: Followed by wound care clinic. Denies any purulent drainage or increase in pain at site of ulcers. MUSCULOSKELETAL: Has chronic pain. Complains of pain at this time, generalized. Denies any specific joint inflammation, swelling, redness. NEUROLOGIC: Numbness and paraplegia are stable. PSYCHIATRIC: Denies any change in baseline status.  PHYSICAL EXAMINATION:  VITAL SIGNS: Temperature 98.7, pulse 94, respirations 18, blood pressure 105/73, oxygen saturation 97% on room air. GENERAL: The patient is bedbound, communicates easily, no acute distress. HEENT: Head is atraumatic, normocephalic. Pupils are equal, round and reactive. Mucous membranes are moist. There are no oral lesions. There is some posterior oropharyngeal erythema, no exudate or blistering. NECK: Supple, symmetric. No lymphadenopathy. RESPIRATORY: Clear to auscultation, fairly shallow respirations, no respiratory distress. CARDIOVASCULAR: Regular rate and rhythm. No murmurs, rubs or gallops.  ABDOMEN: Abdomen is distended, tense, diffusely tender. Bowel sounds are positive, slightly  decreased. Colostomy bag is in place. There is green colon output. Colon mucosa is pink and healthy-appearing. SKIN: No rash. Stage IV decubitus ulcer. NEUROLOGIC: The patient is alert, oriented. No difficulty in communication. Cranial nerves are intact. He has limited mobility of the left upper extremity. Strength is 2/5. Unable to move lower extremities. PSYCHIATRIC: Normal.  LABORATORY, DIAGNOSTIC AND RADIOLOGICAL DATA: Sodium 141, potassium 2.8, chloride 114, bicarb 17, BUN 2, creatinine 0.58, albumin 1.8, amylase 48, lipase 101. White blood cell count is 13.4, hemoglobin 10.3, hematocrit 31.6, platelets 661; MCV is 78. Urinalysis shows 1533 white blood cells, 84 red blood cells.  ASSESSMENT AND PLAN:  1.  Urinary tract infection: 1533 white blood cells in the urine and a serum white blood cell count of 13.4. Cultures are pending of the urine and blood. He has received 1 dose of Rocephin in Emergency Room. Will continue to receive Rocephin every 24 hours until cultures result.  2.  Abdominal pain, distention, nausea and vomiting: CT abdomen and pelvis has been ordered.  3.  Hematemesis: On examination, he is holding a bag of emesis which is frankly red. Will obtain gastroenterology consult. There are no oral lesions present. He complains of sore throat. No obvious source of bleeding is evident on examination. Hemoglobin has gone from 12.2 on May 27 to 10.3 today.  4.  Sacral decubitus ulcer: Will be followed by wound care team in hospital. 5.  Hypokalemia: Correcting in Emergency Room with 40 mEq of oral potassium as well as potassium-infused normal saline. Will recheck in the morning. 6.  Protein calorie malnutrition with albumin of 1.8: Decreased albumin may be due to acute illness. Will continue to follow. The patient does have very decreased muscle mass due to paralysis.  7.  Chronic constipation: Continue stool softeners. 8.  Prophylaxis: Will not start heparin or Lovenox due to hematemesis.  Will use TED stockings.  NOTE: 45 minutes spent on H and P.    ____________________________ Ena Dawleyatherine P. Clent RidgesWalsh, MD cpw:jm D: 12/09/2012 15:23:37 ET T: 12/09/2012 15:53:36 ET JOB#: [id number]  cc: Santina Evansatherine P. Clent RidgesWalsh, MD, <Dictator> Gale JourneyATHERINE P Sheila Ocasio MD ELECTRONICALLY SIGNED 01/08/2013 9:15

## 2014-10-25 NOTE — Discharge Summary (Signed)
PATIENT NAME:  [name] [name], [name] [name] MR#:  [id number] DATE OF BIRTH:  Sep 30, 1965  DATE OF ADMISSION:  12/09/2012 DATE OF DISCHARGE:  12/12/2012  ADMITTING PHYSICIAN: Myrtis Ser, MD  DISCHARGING PHYSICIAN: Gladstone Lighter, MD  PRIMARY CARE PHYSICIAN:  Dr. Nicky Pugh at [facility].  CONSULTANTS:  1.  Urology by Dr. Valma Cava.  2.  Gastroenterology by Dr. Vira Agar. 3.  Palliative care by Dr. Ermalinda Memos. 4.  Infectious disease by Dr. Clayborn Bigness.  DISCHARGE DIAGNOSES: 1.  Systemic antiinflammatory response syndrome.  2.  Clostridium difficile colitis.  3.  C5 injury with chronic paraplegia and bedbound status.  4.  Neurogenic bladder with chronic Foley catheter.  5.  Chronic bilateral hydronephrosis with nephrolithiasis.  6.  Stage IV sacral decub ulcers.  7.  History of recurrent urinary tract infections. 8.  History of diverticulostomy.  9.  Current urine cultures growing multidrug resistant Pseudomonas, extended-spectrum beta-lactamase Escherichia coli and also enterococcus likely colonization and no need for treatment, per infectious disease specialist.   10.  Esophagitis with some hematemesis.  DISCHARGE HOME MEDICATIONS: 1.  Promethazine 25 mg q. 4 hours p.r.n. for nausea and vomiting.  2.  Neurontin 300 mg p.o. 4 times a day at 8:00 a.m., 12:00 p.m., 4:00 p.m. and 8:00 p.m.  3.  Align 4 mg capsule daily.  4.  Protonix 40 mg p.o. b.i.d.  5.  Cymbalta 60 mg p.o. daily.  6.  Ferrous gluconate 1 tablet twice a day.  7.  Liquid for UTI, 30 mL twice a day.  8.  Folic acid 0.8 mg daily.  9.  Robitussin 10 mL q. 6 hours p.r.n. for cough.  10.  Percocet 5/325 mg 1 tablet q. 4 hours p.r.n. for pain.  11.  Milk of magnesia 30 mL daily as needed for constipation.  12.  Zofran 4 mg sublingual q. 4 hours p.r.n. for nausea, vomiting. 13.  Ammonium lactate topical twice a day as needed for dry crusted skin. 14.  OxyContin 20 mg p.o. b.i.d.  15.  Scopolamine patch 1 patch transdermal  every 3 days as needed for nausea.  16.  Metoclopramide 10 mg 4 times a day before meals and also at bedtime.  17.  Ensure liquid once a day with meals.  18.  Remeron 15 mg daily at 8:00 p.m.  19.  Multivitamin with minerals 1 capsule p.o. daily.  20.  Metronidazole 500 mg p.o. q. 6 hours for 12 days.  21.  Sucralfate suspension 10 mL 3 times a day with meals.   DISCHARGE DIET: Regular.  DISCHARGE ACTIVITY: As tolerated.  FOLLOWUP INSTRUCTIONS: 1.  PCP follow-up in 1 to 2 weeks.  2.  With urology in 2 weeks for chronic hydronephrosis.  LABORATORY AND DIAGNOSTICS:  Prior to discharge: WBC 11.6, hemoglobin 8.9, hematocrit 28.5, platelet count 618.   Sodium 142, potassium 3.3, chloride 115, bicarb 20, BUN 1, creatinine 0.67, glucose 118 and calcium of 7.9.   Albumin 1.7, total bilirubin 0.3, alk phos 116, AST 15, ALT 9.   Urine culture is growing ESBL E. coli, multi-drug resistant Pseudomonas and also enterococcus.  Blood cultures are negative. Stool for C. diff is positive.  Strep cultures are negative from the throat.   CT of the abdomen and pelvis on admission showing bilateral nephrolithiasis, 6 mm distal right ureteral calculus, bilateral moderate hydronephrosis, circumferential bladder wall thickening with no outlet obstruction, and multiple decub ulcers are present. Fecal rectal impaction is also present with mild ductal dilatation.  BRIEF HOSPITAL COURSE:  [name] is a 49 year old African American male with past medical history significant for C5 spine injury with chronic paraplegia, bedbound status, who is a resident of [facility] rehab with chronic indwelling Foley catheter for neurogenic bladder and also diverticulostomy who had a recent hospitalization from 11/28/2012 to 12/04/2012 for malaise and was discharged back to rehab, comes back with worsening nausea, vomiting, abdominal pain and diarrhea.  1.  Nausea, vomiting, abdominal pain and diarrhea causing systemic  antiinflammatory response syndrome with tachycardia and leukocytosis on admission. The patient's stool cultures came back positive for C. diff. He probably had developing C. diff on the last admission last week, but  just did not have diarrhea to send the stools for.  He was started on p.o. Flagyl and his symptoms have immensely improved since then. His white count is improving and he is able to tolerate a regular diet and feels happy to go back to [facility]. He was also seen by ID specialist, Dr. Clayborn Bigness, at this time.  2.  History of multiple UTIs with multidrug resistant organisms in urine cultures. When the patient came in with tachycardia and leukocytosis urine cultures were sent which is growing ESBL Escherichia coli, multidrug resistant Pseudomonas and also enterococcus or significant number of colonies. However, because of his prior infections, Dr. Clayborn Bigness, who knows the patient very well, decided not to treat the infection and he says unless he is septic and the patient does not have any other source his UTI is not to be treated and now with the C. diff we do not want more antibiotics on him. The patient clinically feels well and white count is improving so no need to treat the UTI at this time.  3.  Hematemesis.  The patient had vomiting and some hematemesis on admission. He has not had any hematemesis here in the hospital. He does have chronic esophagitis. He was seen by Dr. Vira Agar in consultation and started on Carafate slurry.  He is already on Protonix b.i.d. at the time of discharge. He is able to tolerate a regular diet, as mentioned above, and doing well prior to discharge.  4.  Chronic hydronephrosis and nephrolithiasis.  Currently stable at this time. Has chronic indwelling Foley, making good urine output, and follow up with urology as an outpatient.  5.  Also seen by palliative care because of his chronic abdominal pain; however, the patient said that his pain although is  rating at 7 out of 10, is well controlled with his pain medications and does not want any further changes at this time.  So his other medications are being continued at the time of discharge. His course has been otherwise uneventful.   DISCHARGE CONDITION: Stable.   DISCHARGE DISPOSITION: To [facility] skilled nursing facility.   CODE STATUS: FULL CODE.   TIME SPENT ON DISCHARGE: 45 minutes. ____________________________ Gladstone Lighter, MD rk:sb D: 12/12/2012 12:59:00 ET T: 12/12/2012 14:00:37 ET JOB#: [id number]  cc: Gladstone Lighter, MD, <Dictator> Gladstone Lighter MD ELECTRONICALLY SIGNED 12/25/2012 13:47

## 2014-10-25 NOTE — Consult Note (Signed)
PATIENT NAME:  [name] [name], [name] [name] MR#:  [id number] DATE OF BIRTH:  10-Aug-1965  UROLOGY CONSULTATION  DATE OF SERVICE:  12/10/2012  CONSULTING PHYSICIAN:  Tawni Millers. Richardson Landry II, MD  REASON FOR CONSULTATION: Chronic hydronephrosis, nephrolithiasis, right ureteral stone.   REFERRING PHYSICIAN: Dr. Renae Gloss.   HISTORY OF PRESENT ILLNESS: The patient is a 49 year old African American male well-known to [facility] with a history of C5 injury and quadriplegia. He is a resident of [facility]. He has a history of neurogenic bladder and has a chronic indwelling catheter. He is known to have recurrent urinary tract infections, including pseudomonas, Klebsiella, ESBL E. coli. He presents with complaints of nausea, vomiting, malaise and abdominal pain for 1 week. He has not been febrile. He denies fever, chills, or sweats. He denies any recent passage of stones. He has had stones and surgery before per him, but he is unclear when or who his urologist is. He was recently discharged from [facility] for a UTI 1 week ago. He is admitted to be observational care unit.   PAST MEDICAL HISTORY: 1.  C5 injury.  2.  Chronic Foley, with neurogenic bladder.  3.  Sacral decub.  4.  Bilateral hydro and history of ureteral stones.  5.  History of diverting colostomy.  6.  History of recurrent urinary tract infection.  7.  History of colostomy.   SOCIAL HISTORY: No current alcohol or tobacco, or drug use.   FAMILY HISTORY: Noncontributory.   ALLERGIES: LEVAQUIN AND VANCOMYCIN.   CURRENT MEDICATIONS: Up-to-date, reviewed in detail in the chart and are correct.   REVIEW OF SYSTEMS:  GENERAL: Weakness and fatigue.  HEENT: No complaints. No change in vision.  RESPIRATORY: No shortness of breath.  CARDIOVASCULAR: No chest pain.  GASTROINTESTINAL: He has some nausea. He has no change in his colostomy output, per him.  GENITOURINARY: No complaints. Has chronic Foley. Does not bother him.  SKIN:  He  has no rash. He is followed by wound care. He has known ulcers.  MUSCULOSKELETAL: Chronic pain, generalized.  NEUROLOGIC: Paraplegia; stable.  PSYCHIATRIC: No baseline changes.   PHYSICAL EXAMINATION: VITAL SIGNS: Current vitals: Temperature is 98.5, heart rate 103, respiratory rate 20, blood pressure 117/79, satting 98% on room air.  GENERAL: He is bed-bound, communicates easily. In no acute distress.  HEENT: Normocephalic, atraumatic, sclerae anicteric.  NECK: Supple, without lymphadenopathy.  CHEST: Clear anteriorly. Nonlabored.  CARDIOVASCULAR: He has a regular rhythm. He is slightly tachycardic.  ABDOMINAL EXAM: Distended. Nonperitoneal. He does not appear tender to palpation today. His colostomy bag is in place, draining greenish-brown output. His stoma was intact and healthy-appearing.  SKIN: No rashes.  GENITOURINARY: He has a normal penis, bilaterally descended testicles, with a catheter coming through it.  RECTAL: Exam is deferred.  NEURO: Alert and oriented.  MUSCULOSKELETAL: Unable to move lower extremities.  PSYCHIATRIC: Appropriate.   LABORATORY VALUES: His creatinine is 0.58, consistent with baseline. His white count was 13,000.   ASSESSMENT: A 49 year old man with paraplegia. Known neurogenic bladder, with indwelling Foley. Known history of chronic Foley and recurrent urinary tract infections, and a history of stones, presents with a slightly elevated white count, however this is stable from his recent last admission, and generalized abdominal pain. He has a 6 mm right mid-ureteral stone on CT evaluation. CT scan report and images were reviewed by me. The patient also has several smaller stones within his bladder, 4 to 8 mm, consistent with previously-passed ureteral stones. He has chronic bilateral hydronephrosis  and an amount of renal nephrolithiasis. He does not appear septic today and his kidney function is at baseline.   PLAN: 1.  Continue IV antibiotics, IV  resuscitation per primary team. Awaiting cultures.  2.  With regards to his nephrolithiasis the patient has chronic hydronephrosis, chronic nephrolithiasis, and a history of many ureteral stones. This stone is in the mid-ureter, however unknown how obstructive it is given baseline creatinine and stable white count from 7 days ago. There is potential that urine above this could be a source of white count, however the patient has  perinephric stranding and the signs of worsening hydro on the right. I would like to manage medically at this point in time, as per above. If this patient worsens, could consider percutaneous nephrostomy tube placement versus ureteral stent placement. Would favor percutaneous nephrostomy tube in this setting due to difficulty placing a stent in a patient with known neurogenic bladder and chronic Foley, and a history of many renal stones.  3.  Would recommend Foley catheter change.   This plan was discussed with Dr. Renae GlossWieting.    ____________________________ Tawni MillersEdward R. Weldon InchesHouser, II, MD erh:dm D: 12/10/2012 12:31:51 ET T: 12/10/2012 12:52:26 ET JOB#: [id number]  cc: Tawni MillersEdward R. Weldon InchesHouser, II, MD, <Dictator> Beaulah CorinEDWARD R Grainne Knights MD ELECTRONICALLY SIGNED 12/13/2012 10:24

## 2014-10-25 NOTE — Consult Note (Signed)
PATIENT NAME:  [name] [name], [name] MR#:  [id number] DATE OF BIRTH:  October 28, 1965  DATE OF CONSULTATION:  01/16/2013  REFERRING PHYSICIAN:  Hope PigeonVaibhavkumar G. Elisabeth PigeonVachhani, MD CONSULTING PHYSICIAN:  Rosalyn GessMichael E. Shahzaib Azevedo, MD  REASON FOR CONSULTATION: Recurrent Clostridium difficile colitis.   HISTORY OF PRESENT ILLNESS: The patient is a 49 year old male with a past history significant for quadriplegia, recurrent UTIs and chronic pressure ulcers and C. difficile colitis, who was admitted on July 12 with abdominal pain, chills, malaise and hypotension. He has had several hospitalizations, most recently in June, for C. difficile. He is a resident of a nursing home and was sent over due to his current symptoms. The patient denied any fevers, chills or sweats. He has not had any nausea or vomiting. He had been having 3 to 4 days of loose stool in the colostomy. He was given Zosyn and metronidazole in the Emergency Room, and a C. difficile PCR was positive, and he was changed to fidaxomicin and linezolid yesterday. I stopped the linezolid yesterday. His urine culture is growing Enterococcus, and wound is growing multiple organisms.   ALLERGIES: LEVAQUIN AND VANCOMYCIN.   PAST MEDICAL HISTORY:  1. C. difficile colitis.  2. Quadriplegia with C5-C7 injury.  3. Neurogenic bladder with chronic suprapubic catheter placement.  4. Recurrent resistant UTIs.  5. Stage IV sacral pressure ulcer. This has been evaluated by surgery during previous hospitalizations, and they had not felt that any surgical intervention was necessary.  6. Chronic bilateral hydronephrosis with nephrolithiasis.  7. Status post diverting colostomy.   SOCIAL HISTORY: The patient is a nursing home resident. He is a prior smoker. No injecting drug use history. No alcohol abuse.   FAMILY HISTORY: Positive for gastric cancer and abdominal aneurysm.   REVIEW OF SYSTEMS:  GENERAL: No fevers. Some chills. No sweats. Some malaise and lethargy.  HEENT: No  headaches. No sinus congestion. No sore throat.  NECK: No stiffness. No swollen glands.  RESPIRATORY: No cough, no shortness of breath. No sputum production.  CARDIAC: No chest pains or palpitations. No peripheral edema.  GASTROINTESTINAL: No nausea or vomiting. Some abdominal pain. Positive loose output from his ostomy.  GENITOURINARY: He never has any urinary symptoms. He has a chronic suprapubic Foley in place. He is insensate below the abdomen.  MUSCULOSKELETAL: He is quadriplegic. He has no new muscle or joint complaints.  SKIN: No rashes.  NEUROLOGIC: He has weakness in bilateral lower extremities, which is unchanged.  PSYCHIATRIC: No complaints.  All other systems are negative.   PHYSICAL EXAMINATION:  VITAL SIGNS: T-max of 99.5, T-current of 97.9, pulse 89, blood pressure 124/86, 99% on room air.  GENERAL: A 49 year old black man in no acute distress.  HEENT: Normocephalic, atraumatic. Pupils equally reactive to light. Extraocular motion intact. Sclerae, conjunctivae and lids are without evidence for emboli or petechiae. Oropharynx shows no erythema or exudate. Gums are in fair condition.  NECK: Supple. Full range of motion. Midline trachea. No lymphadenopathy. No thyromegaly.  LUNGS: Clear to auscultation bilaterally with good air movement. No focal consolidation.  CARDIAC: Regular rate and rhythm without murmur, rub or gallop.  ABDOMEN: Soft, nontender, nondistended. No hepatosplenomegaly. He has an ostomy bag in place with some semiliquid stool present.  EXTREMITIES: He has significant muscle mass loss in the lower extremities. He has chronic hyperpigmented changes with a loss of muscle definition. There is no evidence for tenosynovitis.  SKIN: No rashes other than the lower extremity color changes. There were no stigmata of endocarditis, specifically, no  Janeway lesions or Osler nodes. He had several pressure ulcers in the lower extremities in the sacral area. The largest was over  the sacrum. The base appeared to be fairly clean without any purulent discharge. There was no sign of surrounding erythema.  NEUROLOGIC: The patient is quadriplegic, but mentating well.  PSYCHIATRIC: Mood and affect appeared normal.   LABORATORY DATA: BUN of 5, creatinine 0.52, bicarbonate 23, anion gap of 4. LFTs were unremarkable. White count of 8.7 with a hemoglobin of 9.6, platelet count of 574, ANC of 4.2. White count on admission was 15.2. Blood culture from admission showed no growth. A urinalysis from admission had 2+ blood, positive nitrites, 2+ leukocyte esterase, 196 red cells and 1438 white cells. Urine culture is growing greater than 100,000 CFU/mL of Enterococcus  faecium with mixed bacterial organisms. A wound culture is growing E. coli that appears to be an ESBL-producing organism, enterococcus and MRSA. Stool culture is negative. A C. difficile PCR is positive.   IMAGING: CT scan of the abdomen and pelvis demonstrated bilateral nephrolithiasis which is unchanged, with prominence in the collecting system which is unchanged. There is atelectasis in the lower lobes and a prominent amount of fecal material was in the rectum. A chest x-ray showed no infiltrates.   IMPRESSION: A 49 year old male with a history of quadriplegia, recurrent urinary tract infections, chronic pressure ulcers, recent Clostridium difficile colitis, who was admitted with Clostridium difficile colitis.   RECOMMENDATIONS:  1. He has had multiple admissions in the past for UTI, but it is always difficult to interpret the urine culture data in the setting of a quadriplegia with chronic catheterization. These patients have chronic colonization of the bladder with organisms that are often highly resistant. He has been admitted several times recently. The last 2 episodes are likely related to C. difficile rather than UTI or his wounds.  2. He is still C. difficile PCR positive. I am not sure if he received any other  antibiotics in the last few weeks. This could further compromise his normal GI flora.  3. Will treat with fidaxomicin. If he has another recurrence, would consider stool transfer. 4. His sacral wound does not appear to be infected. Would avoid using other antibiotics at this time.  5. Would be reluctant to use empiric antibiotics in the future unless he was obviously ill. A quadriplegic with blood pressure that is somewhat low would not qualify as being ill. A positive UA or culture alone would not be sufficient evidence of infection in this patient. Use of antibiotics indiscriminately would put further pressure on the recovery of his normal bowel flora.   This is a moderately complex infectious disease case.   Thank you much for involving me in Mr. Breighner care.   ____________________________ Rosalyn Gess. Dhara Schepp, MD meb:OSi D: 01/16/2013 13:16:00 ET T: 01/16/2013 14:41:08 ET JOB#: [id number]  cc: Rosalyn Gess. Stuti Sandin, MD, <Dictator> Gates Jividen E Dailee Manalang MD ELECTRONICALLY SIGNED 01/17/2013 9:02

## 2014-10-25 NOTE — Op Note (Signed)
PATIENT NAME:  [name], [name] [name] DATE OF BIRTH:  [birth year]  DATE OF PROCEDURE:  07/13/2012  PREOPERATIVE DIAGNOSES: 1. Quadriplegia.  2. Poor venous access.  3. Recurring urinary tract infections with need for intravenous antibiotics.   POSTOPERATIVE DIAGNOSES:  1. Quadriplegia.  2. Poor venous access.  3. Recurring urinary tract infections with need for intravenous antibiotics.   PROCEDURES: 1. Ultrasound guidance for vascular access, right jugular vein.  2. Fluoroscopic guidance for placement of catheter.  3. Placement of CT-compatible Infuse-a-Port, right jugular vein.   SURGEON: Annice NeedyJason S Grayling Schranz, MD   ANESTHESIA: Local with moderate conscious sedation.   ESTIMATED BLOOD LOSS: Approximately 25 mL.   FLUOROSCOPY TIME:  Less than 1 minute.   CONTRAST USED: None.   INDICATIONS FOR PROCEDURE: A 49 year old [place] male with the above-mentioned issues. He needs a Port-A-Cath as we have had multiple failed previous PICC lines. 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 For these reasons, he is now being transitioned to a Port-A-Cath. The risks and benefits were discussed, and informed consent was obtained.   DESCRIPTION OF PROCEDURE: The patient was brought to vascular interventional radiology suite. The right neck and chest were sterilely prepped and draped, and a sterile surgical field was created. The right jugular vein was visualized with ultrasound and found to be patent. It was then accessed under direct ultrasound guidance without difficulty with a Seldinger needle, and a J-wire was placed. After skin and dilatation, the peel-away sheath was placed over the wire, and the wire and dilator were removed.  I then created a subclavicular incision and an inferior pocket, secured the port to the chest wall with 2 Prolene sutures, connected the catheter to the port and tunneled from the  subclavicular incision to the access site. Fluoroscopic guidance was used to cut the catheter to an appropriate length. It was then placed through the peel-away sheath, and the peel-away sheath was removed. It was parked in an excellent location in the superior vena cava. It withdrew blood well and flushed easily with heparinized saline. The wound was then irrigated with antibiotic-impregnated saline. I closed the subclavicular incision with a 3-0 Vicryl and a 4-0 Monocryl. The access incision was closed with a single 4-0 Monocryl. Dermabond was placed as a dressing. The patient tolerated the procedure well and was taken to the recovery room in stable condition.   ____________________________ Annice NeedyJason S. Ardean Melroy, MD jsd:cb D: 07/13/2012 12:16:53 ET T: 07/13/2012 12:31:29 ET JOB#: [id number]  cc: Annice NeedyJason S. Lexington Krotz, MD, <Dictator> Annice NeedyJASON S Nevae Pinnix MD ELECTRONICALLY SIGNED 07/14/2012 8:33

## 2014-10-25 NOTE — Consult Note (Signed)
Brief Consult Note: Diagnosis: sacral and ischial decubitus ulcers.   Patient was seen by consultant.   Consult note dictated.   Recommend further assessment or treatment.   Comments: No sign of active invasive infection- surface colonization only. No surgical plans. Will sign off. reconsult as needed.  Electronic Signatures: Lattie Hawooper, Chrishauna Mee E (MD)  (Signed 16-Jul-14 16:10)  Authored: Brief Consult Note   Last Updated: 16-Jul-14 16:10 by Lattie Hawooper, Braun Rocca E (MD)

## 2014-10-25 NOTE — Consult Note (Signed)
Pt with recurrent C. diff.  Full consult to follow tomorrow.  Will start dificid.  Stop linezolid.  No need to treat urine in quadraplegic.  Electronic Signatures: Mikiala Fugett MPH, Rosalyn GessMichael E (MD)  (Signed on 14-Jul-14 16:37)  Authored  Last Updated: 14-Jul-14 16:37 by Isaia Hassell MPH, Rosalyn GessMichael E (MD)

## 2014-10-25 NOTE — Consult Note (Signed)
Impression: 49yo male w/ h/o quadraplegia, recurrent UTI, chronic pressure ulcers, recent C. diff colitis admitted with C. diff colitis.  He has had multiple admissions in the past for UTI, but it is very difficult to interpret urine culture data in the setting of quadraplegia and chronic catheterization.  These patients have chronic colonization of the bladder with organisms that are often highly resistant.  He has been admitted several times recently.  The last two episodes are likely related to C. diff rather than UTI or his wounds. 2) He is still C. diff PCR positive.  I am not sure if he has received any other antibiotics in the last few weeks.  This could further compromise his normal GI flora. Will treat with fidixamycin.  If he has another recurrence, would consider stool transfer.        His sacral wound does not appear to be infected.  Would avoid using other antibiotics at this time.        Would be reluctant to use empiric antibiotics in the future, unless he was obviously ill. A positive u/a or culture alone would not be sufficient evidence of infection in this patient.  Use of antibiotics indestriminantly would put further pressure on the recovery of his normal bowel flora.     Electronic Signatures: Teliyah Royal MPH, Rosalyn GessMichael E (MD) (Signed on 15-Jul-14 13:06)  Authored   Last Updated: 15-Jul-14 13:15 by Jasani Lengel MPH, Rosalyn GessMichael E (MD)

## 2014-10-25 NOTE — Consult Note (Signed)
Present Illness The patient is a 49 year old male with history of C5 to C7 injury with quadriplegia, neurogenic bladder requiring chronic Foley catheter, who has a history of Pseudomonas, Klebsiella, E. coli infection in the past, as well as ESBL producing E. coli in the past, who presents with a complaint of just not feeling well. The patient was diagnosed with a UTI and on 05/22 his WBC count was noted to be 17,000 and he was treated with IV antibiotics. However, he has not shown any improvement  He denies any chest pain, shortness of breath. Complains of pain all over. Denies any diarrhea. Also of note his infusiport has not been functioning and I am asked to evaluate.  PAST MEDICAL HISTORY: 1.  History of C5-C7 injury, with quadriplegia and neurogenic bladder, requiring chronic Foley catheter.   2.  Sacral decubitus, stage IV; is followed at the wound care clinic.  3.  Chronic bilateral hydronephrosis, with ureteral stones.  4.  History of diverting colostomy.  5.  History of MRSA infection.   Home Medications: Medication Instructions Status  cefOXitin 1 g injectable powder for injection 1 gram(s) injectable every 6 hours for 7 days (12 am, 6 am, 12 pm, 6 pm) Active  promethazine 25 mg/mL injectable solution 1 milliliter(s) intramuscular every 4 hours, As Needed - for Nausea, Vomiting Active  promethazine 25 mg oral tablet 1 tab(s) orally every 4 hours, As Needed - for Nausea, Vomiting Active  Neurontin 300 mg oral capsule 1 cap(s) orally 4 times a day (8 am, 12 pm, 4 pm, 8 pm) Active  Align 4 mg oral capsule 1 cap(s) orally once a day (8 am) Active  Protonix 40 mg oral delayed release tablet 1 tab(s) orally 2 times a day (8 am, 8 pm) Active  Zofran 2 mg/mL injectable solution 2 milliliter(s) intramuscular every 4 hours, As Needed - for Nausea, Vomiting Active  Cymbalta 60 mg oral delayed release capsule 1 cap(s) orally once a day (8 am) Active  multivitamin with minerals 1 tab(s) orally  once a day (8 am) Active  Vitamin D3 50,000 intl units oral capsule 1 cap(s) orally once a month in the evening Active  ferrous gluconate 27 milligram(s) orally 2 times a day (9 am, 6 pm) **do not crush** Active  Reglan 5 mg oral tablet 1 tab(s) orally 4 times a day (8 am, 12 pm, 4 pm, 8 pm) Active  OxyCONTIN 10 mg oral tablet, extended release 1 tab(s) orally 2 times a day (8 am, 8 pm) Active  UTI-stat liquid 30 milliliter(s) orally 2 times a day (8 am, 4 pm) Active  Megace 40 mg/mL oral suspension 5 milliliter(s) orally 2 times a day (8 am, 4 pm) Active  Senna S 50 mg-8.6 mg oral tablet 2 tab(s) orally 2 times a day (8 am, 4 pm) Active  folic acid 0.8 mg oral tablet 1 tab(s) orally once a day (8 am) Active  Robitussin 100 mg/5 mL oral liquid 10 milliliter(s) orally every 6 hours, As Needed for cough and congestion Active  Percocet 5/325 325 mg-5 mg oral tablet 1 tab(s) orally every 4 hours, As Needed - for Pain Active  Milk of Magnesia 8% oral suspension 30 milliliter(s) orally once a day, As Needed - for Constipation Active  ondansetron 4 mg oral tablet, disintegrating 1 tab(s) sublingual every 4 hours, As Needed - for Nausea, Vomiting Active  Cepacol Sore Throat 1 lozenge orally every 2 hours, As Needed for sore throat Active  Levaquin: Resp. Distress  Vancomycin HCl: Itching  Case History:  Family History Non-Contributory   Social History negative tobacco, negative ETOH, negative Illicit drugs   Review of Systems:  Fever/Chills Yes   Cough No   Sputum No   Abdominal Pain Yes   Diarrhea No   Constipation No   Nausea/Vomiting No   SOB/DOE No   Chest Pain No   Telemetry Reviewed NSR   Dysuria Yes   Physical Exam:  GEN well developed, ill appearing   HEENT hearing intact to voice, moist oral mucosa   NECK supple  trachea midline   RESP normal resp effort  no use of accessory muscles  right sided port intact   CARD regular rate  no JVD   ABD denies  tenderness  soft   EXTR negative cyanosis/clubbing, negative edema   SKIN No rashes, skin turgor poor   NEURO cranial nerves intact, extremities are weak   PSYCH alert, poor insight   Nursing/Ancillary Notes: **Vital Signs.:   30-May-14 00:55  Vital Signs Type Q 4hr  Temperature Temperature (F) 97.9  Celsius 36.6  Temperature Source oral  Pulse Pulse 90  Respirations Respirations 20  Systolic BP Systolic BP 113  Diastolic BP (mmHg) Diastolic BP (mmHg) 76  Mean BP 88  Pulse Ox % Pulse Ox % 96  Pulse Ox Activity Level  At rest  Oxygen Delivery Room Air/ 21 %   Routine Hem:  30-May-14 18:30   WBC (CBC)  13.7  RBC (CBC)  3.72  Hemoglobin (CBC)  9.5  Hematocrit (CBC)  29.3  Platelet Count (CBC)  472  MCV  79  MCH  25.6  MCHC 32.4  RDW  18.6  Neutrophil % 65.4  Lymphocyte % 22.1  Monocyte % 8.9  Eosinophil % 1.1  Basophil % 2.5  Neutrophil #  9.0  Lymphocyte # 3.0  Monocyte #  1.2  Eosinophil # 0.2  Basophil #  0.3 (Result(s) reported on 01 Dec 2012 at 07:02PM.)    Impression 1.  Complication of vascular device         The patient should have a portagram and if the port is nonfunctional then it should be replaced  2.  Urinary tract infection, with possible sepsis: At this time will go ahead and give him aggressive IV fluids. Due to a history of extended-spectrum beta lactamase (ESBL) producing Escherichia coli as well as Pseudomonas, will treat him with IV Invanz. Follow his urine cultures; blood cultures. 3.  Sacral decubitus: We will ask wound care consult.  4.  Chronic constipation: Will continue stool softeners.  5.  Depression, anxiety: Continue duloxetine.  6.  Chronic nausea: Will continue Reglan, Zofran.   Electronic Signatures: Levora Dredge (MD)  (Signed 31-May-14 13:46)  Authored: General Aspect/Present Illness, Home Medications, Allergies, History and Physical Exam, Vital Signs, Labs, Impression/Plan   Last Updated: 31-May-14 13:46 by Levora Dredge (MD)

## 2014-10-25 NOTE — Consult Note (Signed)
Impression: 49yo male w/ h/o quadraplegia, recurrent UTI, chronic pressure ulcers admitted with leukocytosis.  He has had multiple admissions in the past for UTI, but it is very difficult to interpret urine culture data in the setting of quadraplegia and chronic catheterization.  These patients have chronic colonization of the bladder with organisms that are often highly resistant.  His BCx, CXR do not show an obvious source.  His urine culture is growing mixed flora.   Would continue meropenem for now.  If his cultures remain negative, will likely stop antibiotics tomorrow. Would recheck his CBC in am.        Will follow his temperature curve. 5)         Surgery is to return to attempt another evaluation of his pressure ulcers.  This could explain his leukocytosis.  Electronic Signatures: Tamika Nou MPH, Rosalyn GessMichael E (MD) (Signed on 28-May-14 17:23)  Authored   Last Updated: 28-May-14 17:30 by Brendi Mccarroll MPH, Rosalyn GessMichael E (MD)

## 2014-10-25 NOTE — Consult Note (Signed)
CC: vomiting, maybe hematemesis. Protein malnutrition.  May be having chronic UTI given stones in his urinary bladder and hydronephrosis, this could cause vomiting.  Will start carafate slurry before meals for esophagitis.  This was seen clearly on last EGD a year ago.  Electronic Signatures: Scot JunElliott, Robert T (MD)  (Signed on 08-Jun-14 15:58)  Authored  Last Updated: 08-Jun-14 15:58 by Scot JunElliott, Robert T (MD)

## 2014-10-25 NOTE — H&P (Signed)
PATIENT NAME:  [name] [name], [name] MR#:  [id number] DATE OF BIRTH:  1965-07-16  DATE OF ADMISSION:  01/13/2013  PRIMARY CARE PHYSICIAN: Alden ServerErnest B. Maryellen PileEason, MD  CHIEF COMPLAINT: Abdominal pain, chills, body aches and hypotension.   HISTORY OF PRESENT ILLNESS: This is a 49 year old gentleman with history of C5 cord injury and quadriplegia. He is a resident of [facility] and he is here because of low blood pressure today. He does have history of multiple urinary tract infections, as he does have significant neurogenic bladder with chronic indwelling Foley catheter. In the past, he has had Pseudomonas, Klebsiella, ESBL E. coli. In the last admission, whenever he was discharged in June 2014, he was treated for Clostridium difficile after being given antibiotics for multiple UTIs. His Clostridium difficile was severe and by ID, Dr. Leavy CellaBlocker, he recommended not to treat every single urinary tract infection unless he was septic, as he does have significant history of contamination. At this moment, the patient comes having chills, rigors, sweating profusely. He apparently had a low-grade fever of 99.1, and overall he is feeling really bad. He states that for the past 3 to 4 days, he has been having very loose stools in his colostomy and the last time that he had any bowel movement coming out of his rectum, or something coming out of his rectum, was over a week ago. The patient is admitted for followup on these conditions.   REVIEW OF SYSTEMS: The patient is not cooperative. He does not want to answer questions. He says that he is in pain. Unable to get a full review of systems because of patient's noncooperation.   PAST MEDICAL HISTORY: Everything is taken out of the chart. 1.  C5 injury with quadriplegia.  2.  Neurogenic bladder with chronic Foley catheter.  3.  Sacral decubitus ulcer, stage IV.  4.  Chronic bilateral hydronephrosis with ureteral stones.  5.  History of diverting colostomy. 6.  History of  recurrent urinary tract infection.  7.  History of C. difficile colitis.   ALLERGIES: VANCOMYCIN AND LEVAQUIN - RASHES.   PAST SURGICAL HISTORY: Colostomy.   SOCIAL HISTORY: The patient does not drink alcohol. No tobacco or drugs. He is at [facility]. He has history of smoking in the past.    FAMILY HISTORY: Unknown.   CURRENT MEDICATIONS:  1.  Acetaminophen with oxycodone every 4 hours as needed for pain. 2.  Align 4 mg once a day. 3.  Dificid 200 mg 2 times a day.  4.  Ensure 237 mL once a day.  5.  Ferrous gluconate 324 mg twice daily. 6.  Folic acid 0.8 mg once a day. 7.  Guaifenesin as needed for cough.  8.  Heparin flush for PICC line. 9.  Magnesium hydroxide as needed for constipation.  10.  Megace 10 mL twice daily.  11.  Mirtazapine 15 mg at bedtime. 12.  Multivitamins once daily. 13.  Neurontin 300 mg 3 times a day. 14.  Saline flush 5 mL in PICC.  15.  Oxycodone 20 mg every 8 hours. 16.  Protonix 40 mg 2 times a day. 17.  Polyethylene glycol 17 grams once a day.  18.  Potassium chloride 10 mEq oral capsule, take 4 capsules once a day. 19.  Promethazine 12.5 mg as needed for nausea. 20.  Senna as needed for constipation. 21.  Sucralfate 2 times a day.   PHYSICAL EXAMINATION: VITAL SIGNS: Blood pressure 85/67, pulse 104, respirations 22, temperature 99.1.  GENERAL: Alert, oriented x 3. He is in some distress due to pain on and off. No acute respiratory distress. The patient is sweaty and clammy. His clothes are soaked in sweat.  HEENT: Pupils are equal and reactive. Extraocular movements are intact. Mucosae are moist. Anicteric sclerae. Pink conjunctivae. No oral lesions. No oropharyngeal exudates. Normocephalic, atraumatic. Eyes without any significant erythema. Nose without any lesions or drainage.  NECK: Supple. No JVD. No thyromegaly. No adenopathy. No carotid bruits. No rigidity.  CARDIOVASCULAR: Regular rate and rhythm. No murmurs, rubs or  gallops. Slightly tachycardic. No displacement of PMI. No tenderness to palpation of anterior chest wall.  LUNGS: Clear without any wheezing or crepitus. No use of accessory muscles.  ABDOMEN: Distended. No edema of the abdominal wall. No abscesses. No big scars. The patient has a colostomy, and the colostomy has some protrusion of the internal wall. The patient states it always looks like that. There is good viability of the tissue of the colostomy. It is red and moist. There is some liquid stool in the bag. No rebound tenderness. No guarding.  GENITAL: Negative for external lesions. Foley catheter in place.  MUSCULOSKELETAL: Multiple joint deformities, musculoskeletal deformities from scar tissue and previous ulcers, especially in the lower extremities. No cyanosis, clubbing or edema. Decubitus ulcer, presacral, stage IV, very bad smell, purulent secretions. No bleeding.  NEUROLOGIC: The patient is quadriplegic. Decreased sensation all over his body. Cranial nerves overall intact. Extraocular movements are intact. The patient does not move any of his extremities.  PSYCHIATRIC: The patient is in a very bad mood,  not answering questions directly. He is very upset over being here in the hospital. He wants to be helped, but he really does not feel like answering questions right now.  VASCULAR: Pulses are faint in the lower extremities, which is normal for him.  SKIN: As mentioned above, ulcers, especially decubitus ulcer. There are no significant rashes or erythema surrounding the ulcer.   LABORATORY DATA: White count 15.2, hemoglobin 6.8, platelets 752. Glucose is 102, chloride 110; CO2 is 20; creatinine is 0.6. CT scan of the abdomen: Pending reports but shows hydroureters and hydronephrosis, which is known and chronic.   ASSESSMENT AND PLAN: A 49 year old gentleman with:  1.  Sepsis syndrome, possibly coming from infected ulcers. He also has signs of urinary tract infection, but this is chronic  finding. The patient is contaminated. We are trying not to treat that. He has also diarrhea. He has been treated for Clostridium difficile and he is currently taking Dificid. We are going just treat the infection on  the ulcer, as the Dificid has been already added on for his Clostridium difficile colitis. The patient is going to be taking Zyvox, as HE IS ACTUALLY ALLERGIC TO VANCOMYCIN. We are going to obtain an infectious disease consult. As I said, we are not going to treat the possible urinary infection for now.  2.  The patient has a history of quadriplegia, for what he is going to be on a specialty bed to avoid the formation of any ulcers.  3.  The patient has chronic pain,  for what we are going to give him his own pain medications, add on morphine, as we are moving him around for cultures. The patient is quadriplegic, but apparently he still feels a significant amount of pain.  4.  Hypotension: The patient has significant autonomic dysfunction due to his C5. I think this is related to that, as well as the occasional sweating and  diaphoresis.  5.  Anemia: The patient has anemia of chronic disease. His hemoglobin is 6.8. I do not see any significant source of bleeding at this moment. His stool has no melena or blood. We are going to transfuse him 1 unit of packed red blood cells and monitor from there.  6.  Chronic leukocytosis: Continue to observe.  7.  Metabolic acidosis with CO2 slightly decreased: We are going to continue to watch as well. This could be secondary to infection.  8.  Deep vein thrombosis prophylaxis: At this moment, we are going to do low-dose heparin, although the patient might not need it, as he is quadriplegic, does not move. He is actually a low risk for deep vein thrombosis and pulmonary embolism.  9.  He is a full code.   TIME SPENT: I spent about 60 minutes reviewing his care, his case, with his assessment and plan.    ____________________________ Felipa Furnace, MD rsg:jm D: 01/13/2013 18:06:09 ET T: 01/13/2013 19:39:21 ET JOB#: [id number]  cc: Felipa Furnace, MD, <Dictator> Ryin Schillo Juanda Chance MD ELECTRONICALLY SIGNED 01/14/2013 13:31

## 2014-10-25 NOTE — Consult Note (Signed)
Pt CC c. diff colitis.  Pt placed on Flagyl, needs it for 14 days.  20% failure rate.  He was started on carafate slurry for his esophagitis.  Tol ice cream OK.  Electronic Signatures: Scot JunElliott, Alexzandrea Normington T (MD)  (Signed on 09-Jun-14 19:33)  Authored  Last Updated: 09-Jun-14 19:33 by Scot JunElliott, Coury Grieger T (MD)

## 2014-10-25 NOTE — Discharge Summary (Signed)
PATIENT NAME:  [name] [name], [name] [name] MR#:  [id number] DATE OF BIRTH:  Oct 02, 1965  DATE OF ADMISSION:  01/13/2013 DATE OF DISCHARGE:  01/18/2013  ADMITTING PHYSICIAN:  Dr. Mordecai Maes  DISCHARGING PHYSICIAN:  Dr. Enid Baas  PRIMARY CARE PHYSICIAN:  Dr. Maryellen Pile  CONSULTATIONS IN THE HOSPITAL:  1.  IV consultation by Dr. Leavy Cella. 2.  Surgical consultation by Dr. Excell Seltzer.   DISCHARGE DIAGNOSES: 1.  Sepsis syndrome secondary to Clostridium difficile colitis.  2.  Paraplegia with chronic indwelling Foley catheter.  3.  Recurrent and chronic urinary tract infections.  4.  Sacral decubitus ulcer. 5.  Chronic pain syndrome.  DISCHARGE HOME MEDICATIONS: 1.  Align 4 mg capsule p.o. daily.  2.  Folic acid 0.8 mg p.o. daily.  3.  Ensure 1 bottle daily for supplements.  4.  Potassium chloride 40 mEq p.o. daily.  5.  Promethazine 12.5 mg q. 6 hours p.r.n. for nausea and vomiting.  6.  Magnesium hydroxide 30 mL daily as needed for constipation.  7.  Neurontin 300 mg p.o. 3 times a day.  8.  Sucralfate 10 mL 3 times a day.  9.  Protonix 40 mg p.o. b.i.d.  10.  Multivitamin 1 tablet p.o. daily.  11.  Mirtazapine 15 mg p.o. at bedtime.  12.  Megace 40 mg/mL-10 mL twice a day. 13.  Guaifenesin 20 mL q. 4 hours p.r.n. for cough.  14.  Ferrous gluconate 1 tablet 325 mg twice a day.  15.  Percocet 5/325 mg 1 tablet every 4 hours as needed for pain.  16.  Tylenol 650 mg q. 4 hours p.r.n. for pain or fever.  17.  Oxycodone 20 mg p.o. q. 8 hours.  18.  Senokot 1 tablet b.i.d. p.r.n. for constipation.  19.  Dificid 200 mg p.o. b.i.d. for 6 days until 01/23/2013.   DISCHARGE DIET:  Regular diet.   DISCHARGE ACTIVITY: As tolerated.  FOLLOWUP INSTRUCTIONS: 1.  PCP followup in 2 weeks. 2.  I advised not to check urinalysis or urine cultures on this patient unless he is symptomatic, as his UA is always abnormal and he will end up on unnecessary antibiotics that will result in recurrent C. diff.  Last  imaging studies prior to discharge.   WBC 8.7, hemoglobin 9.6, hematocrit 29.1, platelet count 574.  Sodium 142, potassium 4.5, chloride 115, bicarb 23, BUN 5, creatinine 0.52, glucose 83 and calcium of 8.4, magnesium 1.9.  BRIEF HOSPITAL COURSE:  [name] [name] with past medical history secondary to paraplegia has been bedbound for several years now who has a chronic indwelling Foley catheter and prior admissions for UTIs and also C. diff colitis, brought back secondary to body pains, chills and hypotension. He was admitted for sepsis and his stool cultures were positive for C. diff.  1.  Sepsis syndrome likely secondary to Clostridium difficile colitis. The patient has had prior admissions for Clostridium difficile and currently on Dificid.  However, he was started on an antibiotic for possible urinary tract infection at the nursing home which worsened his underlying Clostridium difficile again. The patient's urine cultures are positive even this time, but ID has been following the patient and per their recommendations do not check another urinalysis or cultures on this patient because it is always abnormal unless he is septic and no other sources found. No need to treat that urinary tract infection because that will end up in unnecessary antibiotic exposure resulting in recurrent Clostridium difficile. His diarrhea is improved  now. His stools are more formed. He should be continuing Dificid for another 6 days. Recurrent Clostridium difficile is diagnosed, then stool transplant might be considered at that point.  Continue probiotics every day.  2.  Chronic pain - cont pain medications.  3.  Anemia of chronic disease.  Hemoglobin stable.  Received transfusion 1 unit this admission. 4.  Chronic indwelling Foley catheter.  Urinalysis always abnormal, so please do not recheck it unless there is no other source of sepsis identified. 5.  Sacral decubitus ulcer.  Culture is  growing methicillin-resistant Staphylococcus aureus, but these are superficial organisms per ID and also seen by surgery who did not recommend any debridement  at this time.   6.  The patient's code status has been otherwise uneventful in the hospital.   DISCHARGE CONDITION:  Stable.   DISCHARGE DISPOSITION:  Back to [facility].  TIME SPENT ON DISCHARGE:  40 minutes.    ____________________________ Enid Baasadhika Deontaye Civello, MD rk:ce D: 01/18/2013 12:17:12 ET T: 01/18/2013 12:52:08 ET JOB#: [id number]  cc: Enid Baasadhika Sherrise Liberto, MD, <Dictator> Serita ShellerErnest B. Maryellen PileEason, MD  Enid BaasADHIKA Tighe Gitto MD ELECTRONICALLY SIGNED 01/29/2013 18:38

## 2014-10-25 NOTE — Consult Note (Signed)
PATIENT NAME:  [name] [name], [name]:  [id number] DATE OF BIRTH:  August 02, 1965  DATE OF CONSULTATION:  12/12/2012  REFERRING PHYSICIAN:  Katha HammingSnehalatha Konidena, MD CONSULTING PHYSICIAN:  Rosalyn GessMichael E. Lori Liew, MD  REASON FOR CONSULTATION: Clostridium difficile colitis and bacteriuria.   HISTORY OF PRESENT ILLNESS: The patient is a 49 year old male with a past history significant for quadriplegia, recurrent urinary tract infections and chronic pressure ulcers, who was admitted on 12/09/2012 with nausea, vomiting, abdominal discomfort and malaise. The patient was admitted from 11/28/2012 through 12/04/2012 with malaise and chills. At that time, his urine and blood cultures were unremarkable. His chest x-ray did not show any obvious infiltrates. He had been treated with broad-spectrum antibiotics, including meropenem, during his hospitalization, but subsequently was discharged off antibiotics. Apparently in the 5 days that he was back at the care facility, he developed nausea, vomiting, the malaise worsened again and he developed abdominal pain. He was readmitted to the hospital. He denies having fevers, but he has been having chills and sweats. He was found to have C. difficile PCR positive and was started on metronidazole. He was initially given ceftriaxone on admission. This has been stopped. His urine culture is currently growing E. coli. His white count on admission was 13.4 and has come down to 11.6. Overall, he is feeling better.   ALLERGIES: INCLUDE LEVAQUIN AND VANCOMYCIN.   PAST MEDICAL HISTORY:  1. Quadriplegia with C5-C7 injury.  2. Neurogenic bladder with chronic suprapubic catheter placement.  3. Recurrent resistant UTIs.  4. Stage IV sacral pressure ulcer. This was evaluated by surgery during his last hospitalization and did not feel that there was any evidence for infection.  5. Chronic bilateral hydronephrosis with nephrolithiasis.  6. Status post diverting colostomy.   SOCIAL HISTORY: The  patient is a nursing home resident. He is a prior smoker. No injecting drug use history. He does not drink alcohol.   FAMILY HISTORY: Positive for gastric cancer and abdominal aneurysm.   REVIEW OF SYSTEMS:  GENERAL: No fevers. Positive chills and sweats. Positive malaise.  HEENT: No headaches. No sinus congestion. No sore throat.  NECK: No stiffness. No swollen glands.  RESPIRATORY: No cough. No shortness of breath. No sputum production.  CARDIAC: No chest pains or palpitations. No peripheral edema.  GASTROINTESTINAL: Positive nausea, vomiting, abdominal pain and some bloating. He has had some increased loose output from his ostomy. These have all improved since being admitted.  GENITOURINARY: He is insensate below the abdomen and has no urinary symptoms. He has had no change in his urine color or odor.  MUSCULOSKELETAL: He is quadriplegic, but no new muscle or joint complaints.  SKIN: No rashes.  NEUROLOGIC: Weakness in his bilateral lower extremities, which is unchanged.  PSYCHIATRIC: No complaints. All other systems are negative.   PHYSICAL EXAMINATION:  VITAL SIGNS: T-max of 99.4, T-current of 99.4, pulse 107, blood pressure 104/60, sats of 96% on room air.  GENERAL: A 49 year old black man in no acute distress.  HEENT: Normocephalic, atraumatic. Pupils equal and reactive to light. Extraocular motion intact. Sclerae, conjunctivae and lids without evidence for emboli or petechiae. Oropharynx shows no erythema or exudate. Teeth and gums are in fair condition.  NECK: Supple. Full range of motion. Midline trachea. No lymphadenopathy. No thyromegaly.  CHEST: Clear to auscultation bilaterally with good air movement. No focal consolidation.  CARDIAC: Regular rate and rhythm without murmur, rub or gallop.  ABDOMINAL: Soft. No tenderness. No spasms. No distention. He has a colostomy in place with air  in the bag. No apparent rebound or guarding, although he is quadriplegic, and his sensation is  limited.  EXTREMITIES: No evidence for tenosynovitis.  SKIN: No rashes. No stigmata of endocarditis, specifically, no Janeway lesions or Osler nodes.  NEUROLOGIC: He has flexure contractures in his hands. He has weakness in the upper extremities and no muscle function in the lower extremities.  PSYCHIATRIC: Mood and affect appeared normal.   LABORATORY DATA: Shows a BUN of 1, creatinine 0.67, bicarbonate of 20, anion gap of 7. LFTs were unremarkable. White count of 11.6, hemoglobin of 8.9, platelet count 618, ANC of 6.1. White count on admission was 13.4. Blood cultures from admission show no growth. Urinalysis had 2+ blood, 100 mg/dL of protein, positive nitrites, 2+ leukocyte esterase, 84 red cells and 1533 white cells. Urine culture is growing greater than 100,000 CFUs per milliliter of E. coli, greater than 100,000 CFUs per milliliter of Pseudomonas and 10,000 CFUs per milliliter of Enterococcus. Blood cultures are negative. Strep culture is negative. C. difficile PCR is positive. A chest x-ray from admission shows no acute disease of the chest. CT scan of the abdomen and pelvis showed bilateral nephrolithiasis with a 6 mm distal right ureteral calculus. There were multiple bladder calculi present. There were multiple decubitus ulcers present. There was evidence for rectal fecal impaction.   IMPRESSION: A 49 year old male with a history of quadriplegia, recurrent urinary tract infections, chronic pressure ulcers, who was admitted with Clostridium difficile colitis.   RECOMMENDATIONS:  1. He has had multiple admissions in the past for urinary tract infections, but it is very difficult to interpret urine culture data in the setting of quadriplegia and chronic catheterization. These patients have chronic colonization of the bladder with organisms that are often highly resistant. His blood cultures and chest x-ray do not show an obvious evidence for infection. His urine culture is growing Escherichia coli  and Pseudomonas.  2. He was recently hospitalized last week for malaise. His workup was unrevealing at that time. That may have been Clostridium difficile colitis. Alternatively, he received broad-spectrum antibiotics during that hospitalization and could have developed Clostridium difficile infection subsequently.  3. I agree with metronidazole, would treat for 10 to 14 days.  4. Would avoid using other antibiotics at this time.  5. Would be reluctant to use empiric antibiotics in the future unless he was obviously ill. A positive urinalysis or culture alone would not be sufficient evidence of infection in this patient.   This is a moderately complex infectious disease case. Thank you very much for involving me in [name]. [facility] care.    ____________________________ Rosalyn Gess. Orena Cavazos, MD meb:OSi D: 12/12/2012 12:50:16 ET T: 12/12/2012 13:30:05 ET JOB#: [id number]  cc: Rosalyn Gess. Mazi Schuff, MD, <Dictator> [place] Zapien E Rayfield Beem MD ELECTRONICALLY SIGNED 12/13/2012 9:13

## 2014-10-27 NOTE — Discharge Summary (Signed)
PATIENT NAME:  [name] [name], [name] [name] MR#:  [id number] DATE OF BIRTH:  [birth year]  DATE OF ADMISSION:  11/28/2011 DATE OF DISCHARGE:  12/02/2011  ADMITTING PHYSICIAN: Marlaine Hind, MD  DISCHARGING PHYSICIAN: Enid Baas, MD  PRIMARY CARE PHYSICIAN: Cay Schillings, MD ( Healthcare)  CONSULTANTS: Lynnae Prude, MD - Gastroenterology.   DISCHARGE DIAGNOSES:  1. Intractable nausea and vomiting.  2. Reflux esophagitis and hiatal hernia seen on upper GI endoscopy.  3. C5-C7 injury with quadriplegia.  4. Neurogenic water.  5. 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 6. Recently treated for Pseudomonas and klebsiella urinary tract infection.  7. Sacral decubitus ulcers. 8. Chronic bilateral hydronephrosis with ureteral stones.  9. Diverting colostomy.  DISCHARGE HOME MEDICATIONS: 1. Protonix 40 mg suspension p.o. twice a day.  2. Reglan 10 mg p.o. three times daily prior to meals.  3. Senna 2 tablets 8.6 mg as needed for constipation, hold for loose stools.  4. Acetic acid irrigation solution 120 mL irrigation four times a day and for flushing catheter.  5. Cepacol extra-strength every two hours as needed.  6. Santyl application to left sacral decubitus ulcers every day and as needed.  7. Megace 40 mg/mL - 10 mL p.o. twice a day.  8. Geritussin 10 mL p.o. every 6 hours as needed for cough.  9. Milk of Magnesia 30 mL p.r.n. for constipation.  10. Eucerin topical cream once a day at bedtime.  11. Promethazine 25 mg p.o. every four hours p.r.n. for nausea and vomiting.  12. Promethazine 25 mg intramuscular every four hours p.r.n.  13. Zofran 4 mg orally disintegrating tablet every six hours p.r.n.  14. Percocet 5/325 mg 1 to 2 tablets p.o. every four hours p.r.n.  15. Neurontin 300 mg p.o. four times daily.   DISCHARGE DIET: Regular diet.   DISCHARGE ACTIVITY: As tolerated.   FOLLOWUP INSTRUCTIONS:  1. Follow up with primary care physician in 1 to 2 weeks.   2. Gastroenterology follow-up in 3 to 4 weeks.  3. Wound Care Center follow-up in 2 weeks for his sacral decubitus ulcer.  PROCEDURES: Upper GI endoscopy done on 12/01/2011 showed grade C reflux esophagitis which was biopsied and hiatal hernia with normal duodenal bulb present.   LABS AND IMAGING STUDIES: Sodium 145, potassium 4.4, chloride 114, bicarbonate 20, BUN 3, creatinine 0.42, glucose 85, calcium 7.8.   Stool studies for C. difficile were negative.   Urine cultures have remained negative during the hospital course.   WBC 9.5, hemoglobin 11.6, hematocrit 35.4, and platelet count 674.   Urinalysis showed 3+ leukocyte esterase, trace bacteria, and few WBCs, but cultures are negative.   Troponins remained negative.   Blood cultures were negative during the hospital course.  CT of the abdomen and pelvis showed chronic hydronephrosis bilaterally with ureteral lithiasis. No evidence of bowel obstruction or evidence of abdominal aortic aneurysm. Stomach appears to be partially air-filled and mild to moderately distended. Small bilateral pleural effusions are present and impacted stool within the rectal vault present.   Abdominal x-ray showed bilateral nephrolithiasis and chronic stool in rectum that appears calcified but no bowel obstruction is seen. Small bowel gas is mildly prominent.   BRIEF HOSPITAL COURSE: [name] [name] is a 49 year old African American male with past medical history significant for quadriplegia secondary to C5-C7 neck injury with history of neurogenic bladder, chronic Foley catheter with conization, multiple urinary tract infections, and sacral decubitus ulcers who was just discharged on 11/23/2011 on antibiotics for Klebsiella urinary tract infection. He  was brought back secondary to intractable nausea and vomiting.   Intractable nausea and vomiting: Not sure if it started as a side effect from prior antibiotics. The patient says however it has been going on for 3 to 4  weeks prior to admission, just got worse in the last week. He was given IV fluids and seen by gastroenterology and had an upper GI endoscopy done which showed reflux esophagitis. The patient is being treated with IV Protonix twice a day. That will be changed over to oral. Post endoscopy the patient's symptoms have improved and currently he is advanced to a regular diet and tolerating it well. He does not have any abdominal pain. He can continue to use Zofran and Phenergan p.r.n. for his nausea and vomiting. He was placed on scheduled Reglan and Zofran while in the hospital and will be discharged on scheduled Reglan prior to meals.   All his other medications have been continued while in the hospital. He was also seen by wound care nurse, Ms. Lupita LeashDonna, who recommended to continue Santyl ointment and wet-to-dry dressings and daily changes to twice daily changes of his sacral wounds. No evidence of any acute infection was suspected. Since the patient clinically improved he is being discharged back to [facility]. No changes have been made to his medications   CODE STATUS: FULL CODE.   DISCHARGE CONDITION: Stable.   DISCHARGE DISPOSITION: Skilled nursing facility.   TIME SPENT ON DISCHARGE: 45 minutes.  ____________________________ Enid Baasadhika Ghassan Coggeshall, MD rk:slb D: 12/02/2011 13:29:00 ET     T: 12/02/2011 13:40:56 ET         JOB#: [id number] cc: Enid Baasadhika Mirai Greenwood, MD, <Dictator> Mickie HillierJack H. Sheppard PentonWolf, MD Enid BaasADHIKA Maylon Sailors MD ELECTRONICALLY SIGNED 12/06/2011 10:14

## 2014-10-27 NOTE — Discharge Summary (Signed)
PATIENT NAME:  [name] [name], [name] [name] MR#:  [id number] DATE OF BIRTH:  1965-07-18  DATE OF ADMISSION:  11/20/2011 DATE OF DISCHARGE:  11/23/2011   DIAGNOSES:  1. Urinary tract infection versus chronic colonization with multidrug-resistant Pseudomonas and ESBL Klebsiella, doubt sepsis. 2. Chronic anemia/thrombocytosis. 3. Nausea, vomiting, dehydration, resolved. 4. Hypoxemia, unclear etiology.  5. Multiple decubitus ulcers/wounds.   6. Anorexia. 7. Quadriplegia. 8. Neurogenic bladder with recurrent UTIs. 9. Indwelling Foley catheter.   DISPOSITION: The patient is being discharged to a skilled nursing facility.   ACTIVITY: As tolerated.   DIET: Regular.   FOLLOW-UP:  1. Follow-up with the Wound Care Center. Continue wound care as prescribed by the wound care RN.  2. Follow-up with PCP, Dr. Cay Schillings, in 1 to 2 weeks after discharge.   DISCHARGE MEDICATIONS:  1. Megace 40 mg b.i.d. for 15 days. 2. Elita Quick 500 mg q.8 hours for three days. 3. Tylenol/oxycodone 325/5 mg 1 tablet t.i.d. for pain management.  4. Tylenol/oxycodone 325/5 mg 2 tablets at 6 a.m. for pain management.  5. Promethazine 25 mg q.4 hours p.r.n. nausea, vomiting. 6. Zofran 4 mg q.6 hours p.r.n. nausea, vomiting. 7. Santyl one application once a day and as needed in the evening all over the wounds. 8. Multivitamin with minerals 1 tablet daily.  9. Geri-Tussin 10 mL q.6 hours p.r.n. cough.  10. Eucerin one application topically to bilateral feet every evening. Shift for dry skin. 11. Cepacol sore throat lozenges one lozenge q.2 hours as needed for sore throat. 12. Kay-Ciel 10 mEq daily.  13. Align 4 mg daily.  14. Colace 100 mg b.i.d. as needed for constipation, hold if there are loose stools. 15. Senna 8.5 mg 2 tablets once a day as needed for constipation, hold for loose stools.  16. Milk of Magnesia 8% 30 mL once a day as needed for constipation.  17. Azo-Cranberry 1 tablet b.i.d.  18. Neurontin 300 mg 4 times a day  neuropathy.   LABORATORY, DIAGNOSTIC, AND RADIOLOGICAL DATA: Urine culture grew 80,000 CFU Pseudomonas and 20,000 CFU Klebsiella. Sensitivities were similar to the urine cultures sent during last admission.   Wound culture light growth of gram-negative rods, two types; light growth unidentified organism.   White count 13 on admission, 8.2 by the time discharge; hemoglobin ranging from 12.3 to 10.6, platelet count 786, glucose 76, BUN 6, creatinine 0.41, sodium 140, potassium 3.5, calcium 8, ALT 11, AST 19, total protein 6.9.   HOSPITAL COURSE: The patient is a 49 year old male with past medical history of quadriplegia due to a C5 to C7 injury and neurogenic bladder with recurrent urinary tract infections versus possible colonization who was recently admitted to [facility] from May 13th to May 17th. He was discharged on May 17th and returned to the hospital on May 18th complaining of nausea, vomiting, and poor appetite. During his last hospitalization, the patient's urine had grown more than 100,000 CFU of multidrug-resistant Pseudomonas and Klebsiella. The patient was treated with Elita Quick and discharged to his nursing facility. An ID consult with Dr. Leavy Cella was obtained at that time who felt that this was more likely colonization than true infection. The patient presented back complaining of nausea, vomiting, and not feeling well. He was admitted with a diagnosis of sepsis due to urinary tract infection and decubitus ulcers. He did have a white count but was afebrile and hemodynamically stable and had no overt signs and symptoms of sepsis. He likely has chronic colonization due to his indwelling Foley catheter and  has multiple wounds/decubitus ulcers which had shown no signs of infection. The patient was admitted to the hospital and hydrated with IV fluids. He did not have any further episodes of nausea or vomiting during the hospitalization. He was hypoxic on presentation, however, the etiology was unclear since  his chest x-ray was negative. His saturations remained 100% on room air and they are still. He had multiple decubitus ulcers during his hospitalization. There was no evidence of infection. Given his readmission, Wound Care consult was be reobtained. As per the patient's wound care nurse, the patient's wounds are chronic and stable and likely chronically colonized. The wound care had shown light growth of gram-negative rods of two types. The RN recommended continuing treating the patient's wounds with Santyl. A repeat urine culture showed 80,000 units of Pseudomonas and 20,000 units of Klebsiella. The patient's case was discussed with Dr. Leavy CellaBlocker who felt that this was likely due to chronic colonization. He did not recommend prolonged treatment with antibiotics. The patient's Foley has been changed during this hospitalization. The patient reported poor appetite, therefore, he has been started on Megace.   DISPOSITION: The patient is being discharged to his facility in a stable condition.   TIME SPENT: 45 minutes.    ____________________________ Darrick MeigsSangeeta Rhet Rorke, MD sp:drc D: 11/23/2011 11:30:53 ET T: 11/23/2011 11:44:21 ET JOB#: [id number]  cc: Darrick MeigsSangeeta Cicero Noy, MD, <Dictator> W[facility] Center Mickie HillierJack H. Sheppard PentonWolf, MD Darrick MeigsSANGEETA Roneshia Drew MD ELECTRONICALLY SIGNED 11/24/2011 15:41

## 2014-10-27 NOTE — Discharge Summary (Signed)
PATIENT NAME:  [name], [name] MR#:  [id number] DATE OF BIRTH:  [birth year]  DATE OF ADMISSION:  11/15/2011 DATE OF DISCHARGE:  11/19/2011  HISTORY AND PHYSICAL: For a detailed note, please take a look at the History and Physical done by Dr. Adrian Saran at admission.   DISCHARGE DIAGNOSES:  1. Sepsis secondary to multidrug-resistant urinary tract infection.  2. Urinary tract infection due to Pseudomonas.  3. Quadriplegia.  4. History of chronic lower extremity and buttock wounds, currently not infected, colonized with methicillin-resistant Staphylococcus aureus.  5. Recurrent urinary tract infection with a chronic Foley.  6. Hypokalemia.  DIET: The patient is being discharged on a regular diet.   ACTIVITY: As tolerated.   FOLLOWUP: Follow up with Dr. Cay Schillings at the skilled nursing facility.   DISPOSITION: The patient is being discharged back to his skilled nursing facility.    DISCHARGE MEDICATIONS:  1. Tylenol with oxycodone 1 tab t.i.d.  2. Promethazine 25 mg every 4 hours as needed.  3. Zofran 4 mg every 6 hours as needed.  4. Santyl topical ointment to be applied to all his wounds with dry dressing daily.  5. Lasix 20 mg daily as needed.  6. Multivitamin with minerals daily.  7. Robitussin 10 mL every 6 hours as needed for cough.  8. Aspirin 325 mg daily.  9. Eucerin topical cream to be applied bilaterally to the feet.  10. Cepacol 1 lozenge every 2 hours as needed.  11. Potassium 10 mEq extended release daily.  12. Align 4 mg daily.  13. Colace 100 mg b.i.d.  14. Senokot 2 tabs daily.  15. Milk of Magnesia 30 mL as needed for constipation. 16. Azo cranberry 1 tab b.i.d.  17. Gabapentin 300 mg q.i.d.  18. Ceftazidime 2 mg IV every 8 hours x2 days.    HOSPITAL CONSULTANTS:  Dr. Casimiro Needle Blocker from Infectious Disease.   PERTINENT HOSPITAL LABORATORY, DIAGNOSTIC AND RADIOLOGICAL DATA:  A chest x-ray done on admission showed nonspecific blunting of the posterior gutters.  The lung fields were otherwise clear. Heart size was normal.  A urine culture was shown to be positive for Klebsiella pneumonia and also pseudomonas aeruginosa. The Klebsiella is possibly ESBL positive.   HOSPITAL COURSE: The patient is a 49 year old male with multiple medical problems as mentioned above, presented to the hospital on 11/15/2011 secondary to fevers and not feeling well.   1. Sepsis: The patient presented to the hospital with fevers and also abnormal urinalysis and chronic wounds on his lower extremities and buttocks. The initial thought was that his sepsis was secondary to his wounds, which possibly could be superinfected. The patient actually came to the hospital already taking Zyvox for a colonization of MRSA to his lower extremity wounds. The Zyvox since then has been stopped. The patient was seen in consultation by the Wound team, who has been following him along; and they did not think that the patient's wounds were infected but likely stable. They continued with the local wound care with Santyl cream to his wounds twice daily. The patient's urine culture was positive and it was positive for Klebsiella and Pseudomonas. The patient has had a previous history of multiple UTIs and multidrug-resistant UTIs. The patient had been maintained empirically and ceftazidime when he came to the hospital. Once his urine cultures resulted, an Infectious Disease consult was obtained. The patient was seen by Dr. Leavy Cella. As per his evaluation, it is difficult to interpret the urine culture data on a patient who is  chronically catheterized and is quadriplegic. The patient apparently does have suspected ESBL-producing Klebsiella but has not been receiving any therapy for it and has improved since admission just on Ceftazidime. Therefore, Dr. Leavy CellaBlocker recommended to continue treatment for his Pseudomonas urinary tract infection with a few more days of Ceftazidime to finish a seven-day course. He will continue  to get ceftazidime at the skilled nursing facility through a peripheral IV.  2. Multidrug-resistant urinary tract infection: As mentioned above, the patient has a previous history of it and chronically has a Foley catheter. Therefore, it is difficult to interpret his urine culture data. The patient has improved with ceftazidime therapy, therefore, he will be discharged on that. There is some concern of possibly a Klebsiella pneumonia urinary tract infection with ESBL production, but after being seen by Infectious Disease since the patient has received any antibiotics for this, and he is improved, he likely does not require it at this point.  3. Hypokalemia: The patient's potassium went down to as low as 2.4 during the admission. It has improved with supplementation, and the patient is already taking potassium supplements at the skilled nursing facility.  4. Hypomagnesemia: This has also been supplemented and since then improved.  5. Chronic wounds to his lower extremities and also his buttocks: As mentioned, the patient was seen by Glee Arvinonna Partin, from the Wound consult, who did not think that his wounds were currently infected. She recommended applying the Santyl ointment b.i.d. with dry dressing. For now, he will be discharged on the dressing changes as mentioned.  6. Chronic pain: The patient was maintained on his Tylenol with oxycodone, and he will resume that upon discharge. The patient will also continue his Neurontin for his chronic pain.   He is currently being discharged back to the skilled nursing facility for ongoing care.   CODE STATUS:  The patient is a FULL CODE.    TIME SPENT: 40 minutes.  ____________________________ Rolly PancakeVivek J. Cherlynn KaiserSainani, MD vjs:cbb D: 11/19/2011 14:28:18 ET T: 11/19/2011 14:44:11 ET JOB#: [id number]  cc: Rolly PancakeVivek J. Cherlynn KaiserSainani, MD, <Dictator> Mickie HillierJack H. Sheppard PentonWolf, MD Houston SirenVIVEK J Geraldy Akridge MD ELECTRONICALLY SIGNED 11/25/2011 15:12

## 2014-10-27 NOTE — Op Note (Signed)
PATIENT NAME:  [name] [name], [name] B MR#:  [id number] DATE OF BIRTH:  11-23-1965  DATE OF PROCEDURE:  07/30/2011  PREOPERATIVE DIAGNOSIS: Requiring IV antibiotics for greater than five days.   POSTOPERATIVE DIAGNOSIS: Requiring IV antibiotics for greater than five days.   PROCEDURE PERFORMED: Insertion of right basilic PICC line.   SURGEON: Renford DillsGregory G Fariha Goto, M.D.   DESCRIPTION OF PROCEDURE: The patient is brought to Special Procedures and placed in the supine position with the right arm extended palm upward. Right arm is prepped and draped in sterile fashion. Ultrasound is placed in a sterile sleeve. Ultrasound is utilized secondary to lack of appropriate landmarks and to avoid vascular injury. Under direct ultrasound visualization, the basilic vein is identified. It is echolucent, homogeneous, and easily compressible, indicating patency. Image is recorded. Under direct ultrasound visualization after 1% lidocaine is infiltrated in the soft tissues, a micropuncture needle is inserted into the basilic vein. Microwire is then advanced and with fluoroscopic guidance the wire is positioned with the tip at the atriocaval junction. Measurements are made. A Morpheus 4 French single-lumen PICC line is then trimmed to 40 cm and advanced over the wire through the peel-away sheath. Wire and peel-away sheath are removed. The PICC line aspirates and flushes easily. Under fluoroscopic guidance the tip is within the superior vena cava. It is secured to the skin of the right arm with a StatLock and a sterile dressing is applied. There were no immediate complications.  ____________________________ Renford DillsGregory G. Srinivas Lippman, MD ggs:bjt D: 07/31/2011 13:51:15 ET T: 07/31/2011 14:00:25 ET JOB#: [id number]  cc: Renford DillsGregory G. Abbie Berling, MD, <Dictator> Renford DillsGREGORY G Jerret Mcbane MD ELECTRONICALLY SIGNED 08/18/2011 11:24

## 2014-10-27 NOTE — Consult Note (Signed)
Impression: 49yo BM w/ h/o quadraplegia, recurrent UTI, chronic pressure ulcers admitted with probable UTI.  It is very difficult to interpret urine culture data in the setting of quadraplegia and chronic catheterization.  These patients have chronic colonization of the bladder with organisms that are often highly resistant.  In this case, he presented with sign and symptoms of infection.  His BCx, CXR do not show an obvious source.  His wounds do not appear to be infected.  This leaves the urine.  He has two organisms present, but the Klebsiella appears to be an ESBL producing organism.  As he has not been treated for this and is better, it is less likely causing his symptoms.  The Pseudomonas has been treated with the ceftazidime. I would prefer to treat him for 7 days with ceftazidime.  If he cannot get this medication at the SNF via a peripheral IV, then I would be willing to stop therapy today and see how he does. Were he to worsen, then treatment with ceftaz and ertapenem may be necessary. He would like to go back to the SNF today.  As he is clinically doing well, I think that this is reasonable.  Electronic Signatures: Jamyia Fortune, Rosalyn GessMichael E (MD)  (Signed on 17-May-13 13:27)  Authored  Last Updated: 17-May-13 13:27 by Zekiah Caruth, Rosalyn GessMichael E (MD)

## 2014-10-27 NOTE — H&P (Signed)
PATIENT NAME:  [name] [name], [name] [name] MR#:  [id number] DATE OF BIRTH:  [birth year]  DATE OF ADMISSION:  11/15/2011  PRIMARY CARE PHYSICIAN: The patient says that he does not have one; however, on his lab work it says Dr. Cay Schillings. He is a resident at [facility].   CHIEF COMPLAINT: Fevers and not feeling well.   HISTORY OF PRESENT ILLNESS: This is a 49 year old male with history of C5 paraplegia and multiple wounds as well as a history of multi-drug-resistant urinary tract infections, predominantly pseudomonas, who comes in with the above complaint. Since last Thursday the patient has had nausea, fever, chills, dizziness, and low blood pressures. He was seen today in the wound care clinic and because of his low blood pressure and nausea and just not appearing well he was sent to the ER for further evaluation. At the wound center his systolic blood pressure was 70/50 and his temperature was 98.4. Here in the emergency room, his temperature is 99.2. He has received ceftriaxone and azithromycin.   REVIEW OF SYSTEMS: CONSTITUTIONAL: Positive fever. Positive fatigue. Positive weakness. EYES: No blurred or double vision. ENT: No hearing loss, seasonal allergies, or postnasal drip. RESPIRATORY: He denies any cough, wheezing, hemoptysis, or dyspnea. CARDIOVASCULAR: Denies chest pain, orthopnea, edema, palpitations, or syncope. GI: Positive nausea. Positive vomiting. No diarrhea. No abdominal pain, hematemesis, melena, or ulcers. GENITOURINARY: No dysuria or hematuria. He has a chronic Foley placed. He says the Foley was changed today at [employer]. He denies it has been cloudy. HEME/LYMPH: Positive easy bruising. INTEGUMENT: He has necrotic tissue on his legs. He has multiple wounds, however, since he is lying in the hallway and there are no available rooms, I am unable to assess his wounds. NEUROLOGIC: No history of CVAs or TIAs. He is paraplegic. PSYCH: No anxiety or depression.  MEDICATIONS:   1. Multivitamin 1 tablet daily. 2. Lasix 20 mg daily. 3. K-Chlor 10 mEq daily. 4. Neurontin 300 mg four times daily. 5. Oxycodone 5/325 mg four times daily. 6. Tylenol 325 mg 2 tablets every four hours p.r.n. 7. Aspirin 325 mg daily. 8. Docusate 100 mg twice a day p.r.n. 9. Zofran 4 mg every six hours p.r.n. 10. Milk of Magnesia 30 mL every day p.r.n. 11. Senna laxative two 2 tablets p.r.n.  PAST MEDICAL HISTORY:  1. 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 2. Neurogenic bladder with chronic urinary tract infections. History of pseudomonas in the past which was sensitive to Cefazedone.   SOCIAL HISTORY: The patient smokes half pack a day. No alcohol use. He lives at [facility].   ALLERGIES: Vancomycin and Levaquin which causes shortness of breath.   FAMILY HISTORY: He does not know.   PAST SURGICAL HISTORY: Colostomy.   PHYSICAL EXAMINATION:   VITAL SIGNS: Temperature 99.2, pulse 110, respirations 22, blood pressure 105/64, and saturation 99% on room air.   GENERAL: The patient is alert. He is oriented x3. He is a quadriplegic patient. He does not appear to be in acute distress.   HEENT: Head is atraumatic. Pupils are round and reactive. Sclerae are anicteric. Mucous membranes are dry. Oropharynx is clear.   NECK: Supple without jugular venous distention or carotid bruits.   HEART: Tachycardia without murmur, gallops, or rubs. PMI is nondisplaced.   LUNGS: Anteriorly clear to auscultation without crackles or rales. I was unable to auscultate posteriorly as the patient did not want to be sat up.   ABDOMEN: Bowel sounds are positive. Nontender. He has a colostomy bag.  EXTREMITIES: He has multiple scars and multiple areas of necrosis with necrotic tissue. I was unable to examine any of his wounds as he is sitting on the stretcher in the hallway and there are no empty rooms in the ER at this time. Per the last note from the wound care, he has a right perineum surgical  wound, a left perineum wound, sacral wounds, trochanter wounds, and ischial tuberosity wound, pressure ulcers.   LABS/STUDIES: White blood cells 13.2, hemoglobin 13.7, hematocrit 41.1, and platelets 636. Sodium 138, potassium 3.4, chloride 106, bicarbonate 20, BUN 5, creatinine 0.68, glucose 81, bilirubin 0.2, alkaline phosphatase 100, ALT 11, AST 16, total protein 8.1, and albumin 2.5. Lipase 72. TSH 0.754.  Chest x-ray shows no acute cardiopulmonary disease.   The patient had wound cultures from 11/16/2011 which were positive for MRSA. This is sensitive to clindamycin, vancomycin, linezolid, rifampin, and tetracycline.   ASSESSMENT AND PLAN: A 49 year old African American male with paraplegia who presented to the wound care clinic today with sepsis syndrome including hypotension, tachycardia, and increased respiratory rate.  1. Sepsis syndrome of unclear etiology at this time. It could be from his wounds. As mentioned, I am unable to assess his wounds as he is sitting on a stretcher in the hallway and there is no privacy. Given the location of these wounds he does need some privacy to see these wounds. He also may have a urinary tract infection. However, he has been unable to give us a urine sample, which I am waiting for. I will go ahead and treat his wounds with clindamycin. He has wound cultures which were positive for MRSA. He is allergic to vancomycin. These wound cultures are from 11/06/2011. I will also go ahead and treat him as if he might have a urinary tract infection. In the past, he has multi-drug-resistant urinary tract infections, predominantly pseudomonas, so I am treating him with ceftazidime. Pending urinalysis and cultures, we will continue these antibiotics.  2. Hypokalemia. We will go ahead and supplement with potassium. I will repeat a potassium level tomorrow.  3. Chronic wounds. We will go ahead and continue the wound care as ordered previously. We will also consult the wound team  and as mentioned we will treat the MRSA wounds with clindamycin.  4. Severe protein calorie malnutrition as evidenced by albumin of 2.5. We will consult dietary.   5. Smoking dependence. The patient does not want a nicotine patch. He was counseled for three minutes.  6. Paraplegia. We will continue with medications as written as an outpatient.  CODE STATUS: FULL CODE.  TIME SPENT: Approximately 60 minutes. ____________________________ Janyth ContesSital P. Juliene PinaMody, MD spm:slb D: 11/15/2011 17:03:53 ET T: 11/15/2011 17:40:13 ET JOB#: [id number]  cc: Vinnie Gombert P. Juliene PinaMody, MD, <Dictator> Mickie HillierJack H. Sheppard PentonWolf, MD Janyth ContesSITAL P Avereigh Spainhower MD ELECTRONICALLY SIGNED 11/16/2011 19:46

## 2014-10-27 NOTE — Consult Note (Signed)
Brief Consult Note: Diagnosis: NV, chest pain, shaking.   Patient was seen by consultant.   Consult note dictated.   Comments: Appreciate consult for 49 y/o [place] man with history of quadriplegia secondary to a C5-7 injury, multiple decubiti. neurogenic bladder with chronic UTI, pneumonia, and colostomy placement for intractable NV. Was recently hospitalized for sepsis related to UTI/pneumonia, and was discharged on Fortaz, Ivanz, and Zyvox. Does state that he had NV with the hospitalization- that this issue actually started about a month ago. States everything he tries to eat or drink, he vomits up. Feels nauseated perpetually, has generalized abd pain.  Also reports acid reflux, hearburn, and indigestion. States he has had liquidy green stools via his colostomy for about a week; states his bowel movements are usually formed. Denies black tarry stools, bloody stools, problems swallowing, loss of appetite. Does report weight loss. No history of EGD/colonoscopy. Has been running a low grade temp here, had some low blood pressure overnight, somewhat improved at present. Noted that he is not receiving abx here, but is receiving reglan q6h. Impression: abdominal pain, NV, bowel habit change. Likely multifactorial-                                          1. is on gabapentin and roxicet, which can contribute to gastroparesis/NV- agree with Reglan Will schedule Zofran 4mg  IV q4h x 2d.                                            2. Has been on multiple antibitoics, including Zyvox, Ivanz, and [place]. Will assess stool for c-diff. Does he need to continue on these due to recent sepsis?                                          3. GERD component:  Does also complain of acid reflux: will change Pantoprazole to 40mg  IV bid. May need EGD.                                          4. Did note CT a short while ago- results not available yet- further recs/modifications to current recs to follow: with mild  distension, appearance of calcified stool, and repeated vomiting with po intake,  ? ileus type issue, although no evidence of obstruction on flat plate abdomen..  Electronic Signatures: Vevelyn PatLondon, Christiane H (NP)  (Signed [id number]-May-13 18:21)  Authored: Brief Consult Note   Last Updated: 28-May-13 18:21 by Keturah BarreLondon, Christiane H (NP)

## 2014-10-27 NOTE — H&P (Signed)
PATIENT NAME:  [name] [name], UPCHURCH MR#:  [id number] DATE OF BIRTH:  March 10, 1966  DATE OF ADMISSION:  11/28/2011  PRIMARY CARE PHYSICIAN: Dr. Cay Schillings   CHIEF COMPLAINT: Nausea, vomiting, chest pain, shaking.   HISTORY OF PRESENT ILLNESS: Mr. [name] [name] is a 49 year old African American male with history of quadriplegia, bedridden, requires total care. He has multiple bedsores and colonization with bacteria including recurrent urinary tract infections with also chronic colonization of multiresistant bacteria. The patient was just admitted recently on May 18th and discharged on May 21st. He was ruled out for sepsis and seen again by the wound team. He was treated for nausea and vomiting and after improvement he was discharged home. The patient now tells me that for the last 14 days he was vomiting. This is strange because he was just treated and discharged. In any way, he mentioned to the Emergency Department physician that he has chest pain and given the finding of significant hypokalemia the patient was admitted for observation.   REVIEW OF SYSTEMS: CONSTITUTIONAL: Denies fever but reports shaking. This is not new for him. No sweating. EYES: No blurring of vision. No double vision. ENT: No hearing impairment. No sore throat. No dizziness. CARDIOVASCULAR: He did not report any chest pain for me but he mentioned that to the Emergency Department physician. No shortness of breath. No cough. RESPIRATORY: No cough. No hemoptysis. No shortness of breath. GASTROINTESTINAL: Reports nausea and vomiting. No abdominal pain. No diarrhea. No melena. No hematochezia. GENITOURINARY: No dysuria or frequency of urination. MUSCULOSKELETAL: He reports aching pains all over. No joint swelling. No muscular swelling. INTEGUMENTARY: He has chronic decubitus ulcers on the sacral, ischial, and buttock area. NEUROLOGY: He has quadriplegia. No seizure activity noted. No headache. PSYCHIATRY: No anxiety or depression. ENDOCRINE: No polyuria or  polydipsia. No heat or cold intolerance.   PAST MEDICAL HISTORY:  1. 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 2. Neurogenic bladder with recurrent urinary tract infections and colonization with multiresistant bacteria. Most recent was Klebsiella and also Pseudomonas before that.  3. Chronic indwelling catheter.  4. The patient also has colostomy.   PAST SURGICAL HISTORY: Colostomy and bedsore local care.   SOCIAL HABITS: The patient has history of smoking half a pack a day. No history of drug or alcohol abuse.   SOCIAL HISTORY: He lives on disability and he resides at [facility].   FAMILY HISTORY: The patient stated that he does not know any health information about his family.   MEDICATIONS: He was recently discharged on the following medications:  1. Megace 40 mg twice a day. 2. Tylenol with oxycodone 5/325 1 tablet 3 times a day and 2 tablets in the morning for pain management.  3. Promethazine 25 mg q.4 hours p.r.n. for nausea and vomiting.  4. Zofran 4 mg q.6 hours p.r.n. for nausea and vomiting.  5. Santyl one application once a day for the wound care.  6. Multivitamin once a day. 7. Geri-Tussin 10 mL q.6 hours p.r.n. for cough.  8. Eucerin one application topically to bilateral feet in the evening for dry skin.  9. Potassium 10 mEq once a day.  10. Align 4 mg daily. 11. Colace 100 mg twice a day.  12. Senna 8.5 mg 2 tablets once a day as needed for constipation.  13. Milk of Magnesia 30 mL once a day p.r.n. for constipation.  14. Azo-Cranberry 1 tablet twice a day.  15. Neurontin 300 mg 4 times a day.   ALLERGIES: Levaquin  causes shortness of breath. Vancomycin causes itching.   PHYSICAL EXAMINATION:   VITAL SIGNS: Blood pressure 116/58, respiratory rate 17, pulse 100, temperature 98.7, oxygen saturation 100%.   GENERAL APPEARANCE: Young male laying in bed in no acute distress.   HEAD: No pallor. No icterus. No cyanosis.   EARS, NOSE, AND THROAT: Hearing was  normal. Nasal mucosa, lips, tongue were normal. Normal eyelids and conjunctivae. Pupils are about 5 mm, equal and reactive to light.   NECK: Supple. Trachea at midline. No thyromegaly. No cervical lymphadenopathy. No masses.   HEART: Normal S1, S2. No S3, S4. No murmur. No gallop. No carotid bruits.   RESPIRATORY: Normal breathing pattern without use of accessory muscles. No rales. No wheezing.   ABDOMEN: Soft without tenderness. No masses. No hepatosplenomegaly. No hernias. There is bloated colostomy bag located on the left lower quadrant, looks clean with minimal fecal material and is bloated with air.   MUSCULOSKELETAL: The patient has multiple scar tissues and deformities in his lower extremities. He has contractures of forearms and wrist area. There are multiple bedsores. These are chronic at the left buttock area, presacral area.   SKIN: As above, multiple scar tissues and bedsores. No nodules. No abscesses.   NEUROLOGY: Cranial nerves II through XII are intact. The patient has quadriplegia.   PSYCHIATRY: The patient is alert and oriented x3. Mood and affect were flat.   LABORATORY, DIAGNOSTIC, AND RADIOLOGICAL DATA: EKG showed normal sinus rhythm at rate of 77 per minute. Incomplete right bundle branch block. Nonspecific T wave abnormalities in the anterior chest leads. No old EKG available to compare.   Serum glucose 78, BUN 3, creatinine 0.6, sodium 143, potassium 2.6, calcium low at 7.5, albumin 2.2, total protein 6.6. Liver transaminases were normal. Total CPK 59 with troponin less than 0.02. CBC showed white count of 9000, hemoglobin 11.6, hematocrit 35, platelet count 674.   ASSESSMENT:  1. Chest pain for evaluation. His EKG is unremarkable and first CPK and troponin were normal. The patient had mentioned chest pain to the Emergency Department but he mentioned zero information about any chest pain to me during my encounter. All he says is that he wants pain medication.    2. Vomiting. The patient claimed that he had been vomiting for the last two weeks.  3. Hypokalemia.  4. Chronic bedsores. 5. Quadriplegia.  6. Neurogenic bladder with recurrent urinary tract infections and colonization. The patient is status post colostomy.   PLAN:  1. The patient was admitted for observation. 2. Follow-up cardiac enzymes.  3. Repeat EKG tomorrow morning.  4. Potassium replacement along with IV hydration.  5. Antiemetics using both Zofran and promethazine p.r.n.  6. Pain control with oxycodone 5/325 p.r.n.  7. Repeat basic metabolic profile tomorrow to ensure improvement of his potassium.  8. Continue the rest of his home medications as listed above.  9. Place the patient on full liquid diet for the time being.  10. Wound care to be continued as recommended by the wound team and that consists of changing the dressing twice a day, applying Santyl to sacral and ischial wounds, lightly packing wounds with gauze moistened with normal saline and covering with ABD pads. Also, frequent repositioning in bed q.4 hours or using alternating pressure mattress.   TIME SPENT EVALUATING THIS PATIENT INCLUDING REVIEWING THE MEDICAL RECORDS AND DISCHARGE SUMMARIES: More than 55 minutes.   ____________________________ Carney Corners. Rudene Re, MD amd:drc D: 11/28/2011 07:54:09 ET T: 11/28/2011 10:08:30 ET JOB#: [id number]  cc:  Carney CornersAmir M. Rudene Rearwish, MD, <Dictator> Mickie HillierJack H. Sheppard PentonWolf, MD Zollie ScaleAMIR M Shakeema Lippman MD ELECTRONICALLY SIGNED 11/28/2011 21:34

## 2014-10-27 NOTE — Consult Note (Signed)
Consult discussed with NP who will dictate note.  He has a diversion colostomy due to previous bed sores not healing.  He has had nausea and vomiting for a month and no idea why.  His CT showed the rectum to be impacted.  He does not think anything comes out his rectum.  Will do EGD tomorrow afternoon due to nause and vomiting.    Electronic Signatures: Scot JunElliott, Robert T (MD)  (Signed on 28-May-13 17:04)  Authored  Last Updated: 28-May-13 17:04 by Scot JunElliott, Robert T (MD)

## 2014-10-27 NOTE — Consult Note (Signed)
PATIENT NAME:  [name] [name], [name] [name] MR#:  [id number] DATE OF BIRTH:  April 18, 1966  DATE OF CONSULTATION:  11/30/2011  REFERRING PHYSICIAN:   CONSULTING PHYSICIAN:  Keturah Barre, NP  PRIMARY CARE PHYSICIAN: Cay Schillings, MD   HISTORY OF PRESENT ILLNESS: [name] is a 49 year old African American man with a history of quadriplegia secondary to a C5 through 7 injury, multiple decubiti, neurogenic bladder with chronic urinary tract infection, pneumonia, and colostomy placement. He was admitted two days ago with nausea, vomiting, chest pain, and shaking. GI has been consulted at the request of Dr. Nemiah Commander to evaluate intractable nausea and vomiting. The patient was recently hospitalized for sepsis related to urinary tract infection and pneumonia. He was discharged on Fortaz, Invanz, Zyvox. He states that during that hospitalization he had issues with nausea and vomiting. This issue actually started about a month ago. He states that everything he tries to eat or drink he vomits up, feels nauseated perpetually, and has generalized abdominal pain. He also reports acid reflux, heartburn, and indigestion. He states that his stools have been a liquidy green color via his colostomy for about a week and that his bowel movements are normally formed. He denies black tarry stools, bloody stools, problems swallowing, or loss of appetite. He does report some weight loss. He does not have any history of EGD or colonoscopy. He has been running a low-grade temp here. He did have some low blood pressure overnight, somewhat improved at present. Noted that he is not receiving antibiotics here but is receiving Reglan q.6 six hours. Additionally, on the flat plate x-ray there is a question of calcified stool. The patient had been to CT just prior to my interview and exam with him and the results were pending.   PAST MEDICAL HISTORY:  1. C5 through C7 injury with resultant quadriplegia. 2. Neurogenic bladder.  3. Recurrent  urinary tract infection. 4. Chronic indwelling catheter. 5. Colostomy placement.   PAST SURGICAL HISTORY: Colostomy and wound care due to multiple decubiti.   SOCIAL HISTORY: Smokes 3 to 4 cigarettes a day. No history of alcohol or drug abuse. Lives at [facility].   No known history of blood transfusion.   FAMILY HISTORY: The patient states he is unsure about his family's health history other than cardiac issues. He does not think there is any history of colorectal cancer, liver disease, or ulcers.  MEDICATIONS: 1. Megace 40 mg p.o. b.i.d.  2. Roxicet 5/325 q.3 hours p.r.n.  3. Promethazine 25 mg q.4 hours p.r.n. nausea, vomiting. 4. Zofran 4 mg q.6 p.o. p.r.n. nausea, vomiting. 5. Santyl one application daily wound care. 6. Multivitamin 1 daily.  7. Geri-Tussin 10 mL q.6 hours p.r.n. cough. 8. Eucerin one application topically to feet in the evening. 9. Senna 8.5 mg 2 tabs p.o. daily p.r.n.  10. Milk of Magnesium 30 mL p.o. daily p.r.n. constipation.  11. Neurontin 300 mg p.o. q.i.d.  12. Pantoprazole 40 mg p.o. daily.  13. Reglan 10 mg p.o. a.c. and at bedtime.  14. Fortaz 1 gram q.8 hours. 15. Invanz daily x7 scheduled at 5:30. 16. Zyvox 600 mg b.i.d.   ALLERGIES: Levaquin, vancomycin.   Review of Systems:  General: No recent fever, does have intermittent shaky feeling. No diaphoresis. HEENT: No vision, changes,  hearing impairment, sore throat,problems swallowing Cardiac: No chest pain, orthopnea, edema Pulmonary: No sob, cough, wheeze. GI as noted. GU: Has long term indwelling catheter, recently treated for UTI. MSK: Generalized achinesss. Has lost motor and  sensory function below waist. Has limited movement of BUEs since neck injury. Skin: Has had mutliple  and has chronic decubiti, but otherwise no erythema, lesion, or rash. Neuro: No fainting, seizures, HA. Psych: No complaints of anxiety or depression. Endocrine: No polyuria, polydipsia, temperature  intolerances  Most recent vital signs:  Temp 99.2, BP 92/62, Pulse 101, RR 18  Physical Exam: Gen: Chronically ill appearing man in no acute distress HEENT: Normocephalic, atruamatic. No redness, drainage or inflammation to the eyes or nares. Oral mucous memebranes are pink and moist Neck: Supple, no JVD, adenopathy, thyromegaly Respiratory: Respirations eupneic, Lungs CTAB Cardiac: S1S2, RRR, no MRG, no appreciable edema Abdomen: Flat, bowel sounds x 4, Colostomy bag to left abdomen with thin green stool. Generalized diffuse tenderness, somewhat moreso to both lower quadrants.  No HSM, hernias, rebound tenderness, peritoneal signs GU: Foley intact Rectal: deferred Extremities: Gross motor movement to BUEs , able to grasp objects, but no fine motor movement. No movement to BLEs Skin: No erythmema, lesion, rash: Does have mulitple scars on leg causing deformities to thighs and calves. Dressings intact Psych: pleasant, cooperative, logical thought  MOST RECENT LAB WORK: Glucose 85, BUN 3, creatinine 0.42, sodium 145, potassium 4.4, chloride 114, GFR greater than 60, calcium 7.8. Hepatic panel total serum protein 6.6, albumin 2.2, total bilirubin 0.3, ALP 95, AST 22, ALT 10. CK, CPK-MB within normal limits. Troponin less than 0.02 x 3. WBC 9.5, hemoglobin 11.6, hematocrit 35.4, platelet count 674. Red cells are normocytic with increased RDW. Blood cultures from 05/26 display no growth. Urine culture from indwelling catheter displays no growth. Leukocytes, bacteria, and WBCs noted on urinalysis.   Flat plate of abdomen demonstrated some renal calculi. No bowel obstruction. Moderate amount of fecal material in the colon some of which appears to be calcified.  Chest x-ray did not demonstrate any acute cardiopulmonary disease.   IMPRESSION AND PLAN: Abdominal pain, nausea, vomiting, bowel habit changes, likely multifactorial.  1. He is on gabapentin and Roxicet which can contribute to nausea,  vomiting, gastroparesis. Agree with Reglan. Will schedule Zofran 4 mg IV q.4 hours x2 days. 2. Has been on multiple antibiotics including Zyvox, Invanz, and Fortaz. Will assess stool for C. difficile. Also in regards to his urinalysis and prescribed therapy of the antibiotics, does he need to continue on these during this hospitalization.  3. GERD component. Does also complain of acid reflux. Will change pantoprazole to 40 mg IV b.i.d. He may need EGD while here. 4. Did note CT results initially were not available when I first saw the patient. However, report was available during discussion of patient with Dr. Mechele CollinElliott. CT is concerning for fecal impaction. Evidently he has a history of requiring disimpaction via the rectum in the past despite colostomy placement. This may be contributing to his symptoms at present. We will obtain his old records regarding his colostomy placement and he may require sedated disimpaction for his release.   These services were provided by Vevelyn Pathristiane London, MSN, NPC in collaboration with Lynnae Prudeobert Elliott, MD with whom I have discussed this patient in full.   ____________________________ Keturah Barrehristiane H. London, NP chl:drc D: 11/30/2011 18:27:00 ET T: 12/01/2011 05:47:50 ET JOB#: [id number]  cc: Keturah Barrehristiane H. London, NP, <Dictator> Eustaquio MaizeHRISTIANE H LONDON FNP ELECTRONICALLY SIGNED 12/02/2011 7:57

## 2014-10-27 NOTE — H&P (Signed)
PATIENT NAME:  [name] [name], [name] [name] MR#:  [id number] DATE OF BIRTH:  08-01-1965  DATE OF ADMISSION:  11/20/2011  PRIMARY CARE PHYSICIAN: Cay Schillings, MD   CHIEF COMPLAINT: Feeling sick, chills, shaking, nausea, vomiting and dehydration.   HISTORY OF PRESENT ILLNESS: [name] [name] is a 49 year old African American male who was just discharged yesterday after treatment for sepsis from urinary tract infection that was multidrug resistant. The patient is a quadriplegic, bedridden, and requires total care. The patient was discharged on intravenous Ceftazidime for treatment of multiresistant Klebsiella grown in his urine. The patient states at the nursing home he did not feel better. He is getting sicker. He lost appetite. He has nausea and vomiting and dehydration. He has chills and rigors. The patient was brought back to the Emergency Department for evaluation. Rechecking his urine still appears to be infected. He also has a bedsore on the left buttock area that has some necrotic tissue and needs attention. The patient was admitted again with sepsis picture.   REVIEW OF SYSTEMS: CONSTITUTIONAL: He is unsure if he had fever earlier, but reports rigors and chills, mild fatigue. EYES: No blurring of vision. No double vision. ENT: No hearing impairment. No sore throat. No dizziness. CARDIOVASCULAR: No chest pain. No shortness of breath. No cough. No syncope. RESPIRATORY: No cough. No chest pain. No shortness of breath. GASTROINTESTINAL: No abdominal pain, but reports nausea and vomiting and loss of appetite. GENITOURINARY: The patient is quadriplegic and does not have sensation and generally he cannot feel frequency of urination or dysuria. MUSCULOSKELETAL: He has quadriplegia. He has muscle contractures and multiple scarring of tissue especially in the lower extremities. He reports that he has pain in his buttock area. INTEGUMENTARY: No rash. He has multiple scars in both lower extremities. There is stage III at least with  central necrotic tissue located at the left buttock area associated with discharge, yellowish to greenish. NEUROLOGY: The patient is quadriplegic. No seizure activity. No headache. PSYCHIATRY: No anxiety or depression, but he is short tempered. He did not even want me to examine him or turn him to the side. ENDOCRINE: No polyuria or polydipsia. No heat or cold intolerance.   PAST MEDICAL HISTORY:  1. 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  2. Neurogenic bladder with recurrent urinary tract infections. He had Pseudomonas infection in the past and recently multiresistant Klebsiella that was resistant to all antibiotics with the exception of ceftazidime and imipenem.  3. 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   PAST SURGICAL HISTORY: Colostomy.  SOCIAL HABITS: He has history of smoking 1/2 pack a day. No history of drug or alcohol abuse.   SOCIAL HISTORY: The patient lives on disability and he resides at [facility].   FAMILY HISTORY: The patient cannot provide stating he does not know.   ADMISSION MEDICATIONS: (He was just discharged from the hospital on the following medications)  1. Tylenol with oxycodone 1 tablet three times daily. 2. Promethazine 25 mg every 4 hours p.r.n.  3. Zofran 4 mg every six hours p.r.n.  4. Santyl topical ointment to all wounds daily.  5. Lasix 20 mg daily as needed.  6. Multivitamin once a day. 7. Aspirin 325 mg daily. 8. Robitussin p.r.n. 9. Potassium 10 mEq a day. 10. Align 4 mg daily. 11. Colace 100 mg twice a day. 12. Senokot 2 tablets a day.  13. Milk of Magnesia 30 mL p.r.n. for constipation. 14. Azo Cranberry 1 tablet twice a  day.  15. Gabapentin 300 mg four times daily.  16. Ceftazidime 2 grams IV every 8 hours.  ALLERGIES: Levaquin causing shortness of breath and vancomycin causes itching.   PHYSICAL EXAMINATION:   VITAL SIGNS: Blood pressure 132/84, respiratory rate 20, pulse 103,  temperature 98.6, and oxygen saturation 98%.   GENERAL APPEARANCE: Young male lying in bed in no acute distress.   HEAD AND NECK EXAMINATION: No pallor. No icterus. No cyanosis.   EARS, NOSE, AND THROAT: Hearing was normal. Nasal mucosa, lips, and tongue were normal apart from dry mucous membranes.   EYES: Normal iris and conjunctivae. Pupils are about 4 to 5 mm, equal and reactive to light.   NECK: Supple. Trachea at midline. No thyromegaly. No cervical lymphadenopathy. No masses.   HEART: Normal S1 and S2. No S3 or S4. No murmur. No gallop. No carotid bruits.   RESPIRATORY: Normal breathing pattern without use of accessory muscles. No rales. No wheezing.   ABDOMEN: Soft. No tenderness. No masses. No hepatosplenomegaly. Colostomy site is on the left side of the abdomen draining greenish fecal material.   MUSCULOSKELETAL: The patient has multiple scar tissues and deformities in lower extremities. He has contractures of forearms and wrist area. There is a stage III bedsore located at the left buttock area with central necrotic tissue and yellowish to greenish discharge. No joint swelling.   SKIN: As above, multiple scar tissues and stage III bedsore at the left buttock area.   NEUROLOGIC: The patient has quadriplegia. Otherwise cranial nerves II through XII are intact.   PSYCHIATRY: The patient is alert and oriented x3. Mood and affect are that of slightly anxious and irritable.   LABS/STUDIES: Serum glucose 76, BUN 6, creatinine 0.4, sodium 140, potassium 3.5, calcium 8, and albumin 2.2. Total protein 6.9. Liver transaminases were normal. CBC showed elevated white count at 13,000. His white count four days ago was 8000. His hemoglobin is 12, hematocrit 36, and platelet count is 786.   Urinalysis showed turbid urine, positive nitrite, +3 leukocyte esterase, white blood cells are loaded 2971, and +3 bacteria. This is worse than on 11/19/2011 when his urine white blood cells were 176 and his  bacteria was +1.   Arterial blood gas showed a pH of 7.48, pCO2 27, and pO2 57.   ASSESSMENT:  1. Sepsis syndrome likely secondary to urinary tract infection but sepsis related to the buttock wound is another possibility. 2. Vomiting and dehydration.  3. Hypoxemia, etiology is unclear. This could be secondary to the sepsis, but I need to rule out underlying pneumonia or other etiologies. The patient needs a chest x-ray. This was not done yet in the emergency department.  4. 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 5. Quadriplegia. 6. Neurogenic bladder. 7. The patient has a colostomy.   PLAN: Urine for culture and sensitivity. Unfortunately blood cultures were not taken, but the patient already received an antibiotic. We will change ceftazidime to imipenem.  This is pending results of the urine culture. Swab for culture from the left buttock wound. Consult wound care team. IV hydration with normal saline to treat his dehydration. Intravenous Zofran p.r.n. and oral Phenergan p.r.n. Pain control with oxycodone p.r.n. Continue the rest of the home medications as listed above.   TIME SPENT EVALUATING THIS PATIENT: More than one hour including reviewing his old records.  ____________________________ Carney Corners. Rudene Re, MD amd:slb D: 11/21/2011 00:19:17 ET     T: 11/21/2011 09:23:06 ET  JOB#: [id number] cc: Carney CornersAmir M. Rudene Rearwish, MD, <Dictator> Mickie HillierJack H. Sheppard PentonWolf, MD Carney CornersAMIR M Meiya Wisler MD ELECTRONICALLY SIGNED 11/21/2011 22:15

## 2014-10-27 NOTE — Consult Note (Signed)
PATIENT NAME:  [name] [name], [name] [name] MR#:  [id number] DATE OF BIRTH:  29-Sep-1965  DATE OF CONSULTATION:  11/19/2011  REFERRING PHYSICIAN:  Hilda Lias, MD CONSULTING PHYSICIAN:  Rosalyn Gess. Jubilee Vivero, MD  REASON FOR CONSULTATION: Complicated urinary tract infection.   HISTORY OF PRESENT ILLNESS: The patient is a 49 year old black man with a past history significant for quadriplegia, recurrent urinary tract infection and chronic pressure ulcers who was [facility] when he went for his routine visit for his chronic pressure ulcers. He had been having chills and sweats for approximately five days. He had generalized malaise as well. He was admitted to the hospital and given a dose of azithromycin and ceftriaxone, but then changed to ceftazidime and clindamycin. Zyvox was subsequently added and the clindamycin was stopped. His blood cultures over the course of his hospitalization have been negative. His urine culture is growing Pseudomonas that is sensitive only to ceftazidime and a Klebsiella that is preliminarily identified as a possible ESBL producer. The patient is currently only on ceftazidime. He has clinically done very well and is improved. He is no longer having fevers and chills. He is not having any further malaise and wishes to go home. His white count was 13.2 on admission and has come down the next day to 8.8.   ALLERGIES: Levaquin and vancomycin.   PAST MEDICAL HISTORY:  1. Quadriplegia.  2. Neurogenic bladder with a chronic catheter placement. 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  3. Chronic pressure ulcers.  4. Nephrolithiasis.  5. Status post colostomy.   SOCIAL HISTORY: The patient is a resident of a skilled nursing facility. He is a smoker. He does not drink. No history of injecting drug use.   FAMILY HISTORY: Positive for gastric cancer and abdominal aneurysm.   REVIEW OF SYSTEMS: GENERAL: No fevers. Positive chills.  Positive sweats. Positive malaise. HEENT: No headache. No sinus congestion. No sore throat. NECK: No stiffness. No swollen glands. RESPIRATORY: No cough or shortness of breath. No sputum production. CARDIAC: No chest pains or palpitations. GASTROINTESTINAL: Some nausea, no vomiting, no abdominal pain, no change in his bowels. GU: No complaints but he does not have much sensation due to his quadriplegia. MUSCULOSKELETAL: He has quadriplegia and has limited motion of his upper extremities and none in his lower. SKIN: He has chronic pressure ulcers and has been followed in the [facility]. PSYCHIATRIC: No complaints. All other systems are negative.   PHYSICAL EXAMINATION:  VITAL SIGNS: T-max 99.5, T-current 98.7, pulse 96, blood pressure 111/76, 97% on room air.   GENERAL: 49 year old black man in no acute distress.   HEENT: Normocephalic, atraumatic. Pupils equal and reactive to light. Extraocular motion intact. Sclerae, conjunctivae, and lids are without evidence for emboli or petechiae. Oropharynx: No erythema or exudate. Gums are in fair condition.   NECK: Supple. Full range of motion. Midline trachea. No lymphadenopathy. No thyromegaly.   LUNGS: Clear to auscultation bilaterally with good air movement. No focal consolidation.   HEART: Regular rate and rhythm without murmur, rub, or gallop.   ABDOMEN: Soft, nontender, nondistended. No hepatosplenomegaly. There is a colostomy bag in place.   EXTREMITIES: The patient has decreased muscle mass in the lower extremities. He has no evidence for tenosynovitis.   SKIN: No rashes. No stigmata of endocarditis, specifically no Janeway lesions or Osler nodes. He has several pressure ulcers that were not unwrapped and evaluated, but had been seen by the [facility] Center earlier.  NEUROLOGIC: The patient is quadriplegic with some movement of his hands.   PSYCHIATRIC: Mood and affect appeared normal.   LABORATORY, DIAGNOSTIC, AND RADIOLOGICAL DATA: BUN 4,  creatinine 0.51, bicarbonate 21, anion gap 12, white count from 05/14 was 8.8 with a hemoglobin 10.6, platelet count of 551, ANC 3.9. White count on admission was 13.2. Blood cultures from admission show no growth. A urinalysis from admission had 2+ blood, positive nitrates, 3+ leukocyte esterase and 253 white cells. Urine culture is growing pseudomonas sensitive only to ceftazidime and Klebsiella which preliminarily has been identified as a ESBL producing organism. Repeat urinalysis from today continue to show 2+ blood, positive nitrites and 3+ leukocyte esterase with 176 white cells. A chest x-ray was unremarkable.   IMPRESSION: This is a 49 year old black man with a history of quadriplegia, recurrent urinary tract infections and chronic pressure ulcers admitted with probable urinary tract infection.   RECOMMENDATIONS:  1. It is very difficult to interpret urine culture data in the setting of quadriplegia and chronic catheterization. These patients have chronic colonization of the bladder with organisms that are often highly resistant. In this case he presented with signs and symptoms of infection. His blood culture and chest x-ray do not show an obvious source. His wounds do not appear to be infected. This leaves the urine. He has two organisms present, but the Klebsiella appears to be an ESBL producing organism. As he has not been treated for this and is better, it is less likely to be causing his symptoms. The Pseudomonas has been treated with the ceftazidime.   2. I would prefer to treat him for seven days with ceftazidime. If he cannot get this medication at the skilled nursing facility versus a peripheral IV, then I would be willing to stop the therapy today and see how he does.  3. Were he to worsen, then treatment with ceftazidime and ertapenem may be necessary.  4. He would like to go back to the skilled nursing facility today. As he is clinically doing well, I think this is reasonable.   Thank  you very much for involving me in Mr. Gigi Ginnoch's care. This is a moderately complex infectious disease case.   ____________________________ Rosalyn GessMichael E. Shadoe Cryan, MD meb:ap D: 11/19/2011 13:34:56 ET T: 11/19/2011 13:53:40 ET JOB#: [id number]  cc: Rosalyn GessMichael E. Ian Castagna, MD, <Dictator>  Piedad Standiford E Nefi Musich MD ELECTRONICALLY SIGNED 11/24/2011 12:43
